# Patient Record
Sex: Male | Born: 1950 | Race: White | Hispanic: No | Marital: Married | Smoking: Former smoker
Health system: Southern US, Community
[De-identification: ages and names within clinical notes are randomized; demographics above are authoritative.]

## PROBLEM LIST (undated history)

## (undated) DIAGNOSIS — F17200 Nicotine dependence, unspecified, uncomplicated: Secondary | ICD-10-CM

## (undated) DIAGNOSIS — R3129 Other microscopic hematuria: Secondary | ICD-10-CM

## (undated) DIAGNOSIS — R7301 Impaired fasting glucose: Secondary | ICD-10-CM

## (undated) DIAGNOSIS — K5790 Diverticulosis of intestine, part unspecified, without perforation or abscess without bleeding: Secondary | ICD-10-CM

## (undated) DIAGNOSIS — E785 Hyperlipidemia, unspecified: Secondary | ICD-10-CM

## (undated) DIAGNOSIS — K648 Other hemorrhoids: Secondary | ICD-10-CM

## (undated) DIAGNOSIS — K635 Polyp of colon: Secondary | ICD-10-CM

## (undated) HISTORY — DX: Other hemorrhoids: K64.8

## (undated) HISTORY — DX: Polyp of colon: K63.5

## (undated) HISTORY — DX: Diverticulosis of intestine, part unspecified, without perforation or abscess without bleeding: K57.90

## (undated) HISTORY — PX: COLONOSCOPY: SHX174

## (undated) HISTORY — DX: Impaired fasting glucose: R73.01

## (undated) HISTORY — DX: Nicotine dependence, unspecified, uncomplicated: F17.200

## (undated) HISTORY — DX: Hyperlipidemia, unspecified: E78.5

## (undated) HISTORY — DX: Other microscopic hematuria: R31.29

---

## 1994-12-09 HISTORY — PX: CHEST TUBE INSERTION: SHX231

## 2006-02-06 DIAGNOSIS — R7301 Impaired fasting glucose: Secondary | ICD-10-CM

## 2006-02-06 HISTORY — DX: Impaired fasting glucose: R73.01

## 2006-10-23 ENCOUNTER — Ambulatory Visit: Payer: Self-pay | Admitting: Family Medicine

## 2006-11-25 ENCOUNTER — Ambulatory Visit: Payer: Self-pay | Admitting: Family Medicine

## 2007-04-23 ENCOUNTER — Ambulatory Visit: Payer: Self-pay | Admitting: Family Medicine

## 2007-08-31 ENCOUNTER — Ambulatory Visit: Payer: Self-pay | Admitting: Internal Medicine

## 2007-09-10 ENCOUNTER — Encounter: Payer: Self-pay | Admitting: Internal Medicine

## 2007-09-10 ENCOUNTER — Ambulatory Visit: Payer: Self-pay | Admitting: Internal Medicine

## 2007-09-21 ENCOUNTER — Ambulatory Visit: Payer: Self-pay | Admitting: Family Medicine

## 2008-04-12 ENCOUNTER — Ambulatory Visit: Payer: Self-pay | Admitting: Family Medicine

## 2008-11-09 ENCOUNTER — Ambulatory Visit: Payer: Self-pay | Admitting: Family Medicine

## 2009-01-10 ENCOUNTER — Ambulatory Visit: Payer: Self-pay | Admitting: Family Medicine

## 2009-02-15 ENCOUNTER — Ambulatory Visit: Payer: Self-pay | Admitting: Family Medicine

## 2010-01-24 ENCOUNTER — Ambulatory Visit: Payer: Self-pay | Admitting: Family Medicine

## 2010-03-12 ENCOUNTER — Ambulatory Visit: Payer: Self-pay | Admitting: Family Medicine

## 2010-03-13 ENCOUNTER — Encounter: Admission: RE | Admit: 2010-03-13 | Discharge: 2010-03-13 | Payer: Self-pay | Admitting: Family Medicine

## 2010-05-08 ENCOUNTER — Ambulatory Visit: Payer: Self-pay | Admitting: Physician Assistant

## 2010-12-20 ENCOUNTER — Ambulatory Visit
Admission: RE | Admit: 2010-12-20 | Discharge: 2010-12-20 | Payer: Self-pay | Source: Home / Self Care | Attending: Family Medicine | Admitting: Family Medicine

## 2011-07-24 ENCOUNTER — Other Ambulatory Visit: Payer: Self-pay | Admitting: *Deleted

## 2011-07-24 DIAGNOSIS — E785 Hyperlipidemia, unspecified: Secondary | ICD-10-CM

## 2011-07-24 MED ORDER — ROSUVASTATIN CALCIUM 20 MG PO TABS: 20.0000 mg | ORAL_TABLET | Freq: Every day | ORAL | Status: DC

## 2011-09-03 ENCOUNTER — Encounter: Payer: Self-pay | Admitting: Family Medicine

## 2011-09-09 ENCOUNTER — Ambulatory Visit (INDEPENDENT_AMBULATORY_CARE_PROVIDER_SITE_OTHER): Payer: 59 | Admitting: Family Medicine

## 2011-09-09 ENCOUNTER — Encounter: Payer: Self-pay | Admitting: Family Medicine

## 2011-09-09 VITALS — BP 130/80 | HR 72 | Ht 70.0 in | Wt 163.0 lb

## 2011-09-09 DIAGNOSIS — Z125 Encounter for screening for malignant neoplasm of prostate: Secondary | ICD-10-CM

## 2011-09-09 DIAGNOSIS — E7800 Pure hypercholesterolemia, unspecified: Secondary | ICD-10-CM

## 2011-09-09 DIAGNOSIS — Z23 Encounter for immunization: Secondary | ICD-10-CM

## 2011-09-09 DIAGNOSIS — F17200 Nicotine dependence, unspecified, uncomplicated: Secondary | ICD-10-CM

## 2011-09-09 DIAGNOSIS — Z0000 Encounter for general adult medical examination without abnormal findings: Secondary | ICD-10-CM

## 2011-09-09 DIAGNOSIS — E785 Hyperlipidemia, unspecified: Secondary | ICD-10-CM

## 2011-09-09 LAB — LIPID PANEL
Cholesterol: 147 mg/dL (ref 0–200)
HDL: 43 mg/dL (ref >39)

## 2011-09-09 LAB — COMPREHENSIVE METABOLIC PANEL
ALT: 28 U/L (ref 0–53)
AST: 23 U/L (ref 0–37)
BUN: 16 mg/dL (ref 6–23)
CO2: 26 mEq/L (ref 19–32)
Chloride: 102 mEq/L (ref 96–112)
Creat: 1.12 mg/dL (ref 0.50–1.35)
Glucose, Bld: 92 mg/dL (ref 70–99)
Total Bilirubin: 0.5 mg/dL (ref 0.3–1.2)

## 2011-09-09 LAB — PSA: PSA: 2.04 ng/mL (ref <=4.00)

## 2011-09-09 LAB — POCT URINALYSIS DIPSTICK
Bilirubin, UA: NEGATIVE
Ketones, UA: NEGATIVE
Leukocytes, UA: NEGATIVE
Protein, UA: NEGATIVE
pH, UA: 5

## 2011-09-09 MED ORDER — ROSUVASTATIN CALCIUM 20 MG PO TABS: 20.0000 mg | ORAL_TABLET | Freq: Every day | ORAL | Status: DC

## 2011-09-09 NOTE — Patient Instructions (Addendum)
HEALTH MAINTENANCE RECOMMENDATIONS:  It is recommended that you get at least 30 minutes of aerobic exercise at least 5 days/week (for weight loss, you may need as much as 60-90 minutes). This can be any activity that gets your heart rate up. This can be divided in 10-15 minute intervals if needed, but try and build up your endurance at least once a week.  Weight bearing exercise is also recommended twice weekly.  Eat a healthy diet with lots of vegetables, fruits and fiber.  "Colorful" foods have a lot of vitamins (ie green vegetables, tomatoes, red peppers, etc).  Limit sweet tea, regular sodas and alcoholic beverages, all of which has a lot of calories and sugar.  Up to 2 alcoholic drinks daily may be beneficial for men (unless trying to lose weight, watch sugars).  Drink a lot of water.  Sunscreen of at least SPF 30 should be used on all sun-exposed parts of the skin when outside between the hours of 10 am and 4 pm (not just when at beach or pool, but even with exercise, golf, tennis, and yard work!)  Use a sunscreen that says "broad spectrum" so it covers both UVA and UVB rays, and make sure to reapply every 1-2 hours.  Remember to change the batteries in your smoke detectors when changing your clock times in the spring and fall.  Use your seat belt every time you are in a car, and please drive safely and not be distracted with cell phones and texting while driving.   PLEASE QUIT SMOKING!!  Please check into Zostavax (shingles vaccine) coverage with your insurance.  If you would like the shot, schedule a nurse visit  F/u with dentist for routine oral screenings

## 2011-09-09 NOTE — Progress Notes (Signed)
Travis Baldwin is a 60 y.o. male who presents for a complete physical.  He has the following concerns: Lump behind L ear, described as a "knot", which has been present for years.  Only slightly larger than when originally noted, no change in size over the last 2 years.  Not painful.  Hyperlipidemia follow-up:  Patient is reportedly following a low-fat, low cholesterol diet.  Compliant with medications and denies medication side effects   Immunization History  Administered Date(s) Administered  . Influenza Whole 09/09/1989, 12/05/1999  . PPD Test 09/10/1995  . Pneumococcal Polysaccharide 09/10/1995  . Td 11/23/1997  . Tdap 01/10/2009   Last colonoscopy: 2008, Dr. Leone Payor (hyperplastic and adenomatous polyp) Last PSA: 05/2010 Exercise: walking on the job, otherwise no exercise Ophtho: yearly Dentist: a couple of years ago (had all his teeth pulled)  Past Medical History  Diagnosis Date  . Hyperlipidemia   . Impaired fasting glucose 02/2006  . Smoker   . Colon polyp     hyperplastic, adenomatous (2008); Dr. Leone Payor  . Diverticulosis   . Microscopic hematuria     followed by [facility]; normal cystoscopy 05/2009    Past Surgical History  Procedure Date  . Chest tube insertion 1996    pockets of infection on outside of lung    History   Social History  . Marital Status: Married    Spouse Name: Aggie Cosier    Number of Children: 0  . Years of Education: N/A   Occupational History  . 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 Smoking status: Current Everyday Smoker -- 1.0 packs/day for 39 years  . Smokeless tobacco: Never Used  . Alcohol Use: Yes     6 drinks per week (one night/week)  . Drug Use: No  . Sexually Active: Yes -- Male partner(s)   Other Topics Concern  . Not on file   Social History Narrative  . No narrative on file    Family History  Problem Relation Age of Onset  . Hypertension Mother   . Hyperlipidemia  Mother   . Cancer Father     bone (jaw)  . Cancer Brother     sarcoma  . Diabetes Maternal Aunt   . Diabetes Maternal Grandmother   . Diabetes Maternal Grandfather     Current outpatient prescriptions:Aspirin-Salicylamide-Caffeine (BC HEADACHE POWDER PO), Take 1 packet by mouth as needed.  , Disp: , Rfl: ;  Multiple Vitamins-Minerals (MULTIVITAMIN WITH MINERALS) tablet, Take 1 tablet by mouth daily.  , Disp: , Rfl: ;  naproxen (NAPROSYN) 500 MG tablet, Take 500 mg by mouth as needed.  , Disp: , Rfl: ;  Pseudoeph-Doxylamine-DM-APAP (NYQUIL PO), Take 1 each by mouth as needed.  , Disp: , Rfl:  rosuvastatin (CRESTOR) 20 MG tablet, Take 1 tablet (20 mg total) by mouth at bedtime., Disp: 30 tablet, Rfl: 11  Allergies  Allergen Reactions  . Codeine Hives  . Vicodin (Hydrocodone-Acetaminophen) Other (See Comments)    Shakiness.   ROS: The patient denies anorexia, fever, weight changes, headaches,  vision loss (wears 2 different types of contacts--1 for reading, other for distance), decreased hearing, ear pain, hoarseness, chest pain, palpitations, dizziness, syncope, dyspnea on exertion, cough, swelling, nausea, vomiting, diarrhea, constipation, abdominal pain, melena, indigestion/heartburn, gross hematuria, incontinence, erectile dysfunction, nocturia, weakened urine stream, dysuria, genital lesions, joint pains, numbness, tingling, weakness, tremor, suspicious skin lesions, depression, anxiety, abnormal bleeding/bruising, or enlarged lymph nodes +occasional BRB on toilet paper (known hemorrhoids); occasional  back strain  PHYSICAL EXAM: BP 130/80  Pulse 72  Ht 5\' 10"  (1.778 m)  Wt 163 lb (73.936 kg)  BMI 23.39 kg/m2  General Appearance:    Alert, cooperative, no distress, appears stated age  Head:    Normocephalic, without obvious abnormality, atraumatic  Eyes:    PERRL, conjunctiva/corneas clear, EOM's intact, fundi    benign  Ears:    Normal TM's and external ear canals. 4-5 mm  subcutaneous mobile cyst behind L ear  Nose:   Nares normal, mucosa normal, no drainage or sinus   tenderness  Throat:   Lips, mucosa, and tongue normal; wearing upper and lower dentures.  Visualized mucosa appears normal  Neck:   Supple, no lymphadenopathy;  thyroid:  no   enlargement/tenderness/nodules; no carotid   bruit or JVD  Back:    Spine nontender, no curvature, ROM normal, no CVA     tenderness  Lungs:     Clear to auscultation bilaterally without wheezes, rales or     ronchi; respirations unlabored  Chest Wall:    No tenderness or deformity   Heart:    Regular rate and rhythm, S1 and S2 normal, no murmur, rub   or gallop  Breast Exam:    No chest wall tenderness, masses or gynecomastia  Abdomen:     Soft, non-tender, nondistended, normoactive bowel sounds,    no masses, no hepatosplenomegaly  Genitalia:    Normal male external genitalia without lesions.  Testicles without masses.  No inguinal hernias.  Rectal:    Normal sphincter tone, no masses or tenderness; guaiac negative stool.  Prostate smooth, no nodules, slightly enlarged.  Extremities:   No clubbing, cyanosis or edema  Pulses:   2+ and symmetric all extremities  Skin:   Skin color, texture, turgor normal, no rashes or lesions. Mole R cheek oddly shaped (like L-quote), unchanged per pt, uniform color  Lymph nodes:   Cervical, supraclavicular, and axillary nodes normal  Neurologic:   CNII-XII intact, normal strength, sensation and gait; reflexes 2+ and symmetric throughout          Psych:   Normal mood, affect, hygiene and grooming.     ASSESSMENT/PLAN:  1. Routine general medical examination at a health care facility  POCT Urinalysis Dipstick, Visual acuity screening  2. Need for pneumococcal vaccination  Pneumococcal polysaccharide vaccine 23-valent greater than or equal to 2yo subcutaneous/IM  3. Need for influenza vaccination  Flu vaccine greater than or equal to 3yo preservative free IM  4. Pure hypercholesterolemia   Comprehensive metabolic panel, Lipid panel  5. Special screening for malignant neoplasm of prostate  PSA  6. Hyperlipemia  rosuvastatin (CRESTOR) 20 MG tablet    PSA screening, recommended at least 30 minutes of aerobic activity at least 5 days/week; proper sunscreen use reviewed; healthy diet and alcohol recommendations (less than or equal to 2 drinks/day) reviewed; regular seatbelt use; changing batteries in smoke detectors. Self-testicular exams. Immunization recommendations discussed--pneumovax and flu shot given.  Zostavax recommended and discussed--to check insurance coverage.  Colonoscopy recommendations reviewed--likely due 2013.  Counseled regarding smoking cessation

## 2011-09-10 ENCOUNTER — Encounter: Payer: Self-pay | Admitting: Family Medicine

## 2011-11-04 ENCOUNTER — Encounter: Payer: Self-pay | Admitting: Family Medicine

## 2011-11-04 ENCOUNTER — Ambulatory Visit (INDEPENDENT_AMBULATORY_CARE_PROVIDER_SITE_OTHER): Payer: 59 | Admitting: Family Medicine

## 2011-11-04 VITALS — BP 138/80 | HR 80 | Ht 70.0 in | Wt 165.0 lb

## 2011-11-04 DIAGNOSIS — G5700 Lesion of sciatic nerve, unspecified lower limb: Secondary | ICD-10-CM

## 2011-11-04 DIAGNOSIS — M5430 Sciatica, unspecified side: Secondary | ICD-10-CM

## 2011-11-04 DIAGNOSIS — M5432 Sciatica, left side: Secondary | ICD-10-CM

## 2011-11-04 MED ORDER — TRAMADOL HCL 50 MG PO TABS: 50.0000 mg | ORAL_TABLET | Freq: Four times a day (QID) | ORAL | Status: DC | PRN

## 2011-11-04 MED ORDER — CYCLOBENZAPRINE HCL 10 MG PO TABS: 10.0000 mg | ORAL_TABLET | Freq: Three times a day (TID) | ORAL | Status: DC | PRN

## 2011-11-04 MED ORDER — METHYLPREDNISOLONE 4 MG PO KIT: PACK | ORAL | Status: AC

## 2011-11-04 NOTE — Progress Notes (Signed)
Chief complaint:  left hip and leg pain x 2 weeks. Pt states that he had a cold and he coughed and pulled a muscle in his back, that pain lasted a week and went away. Now complains about hip and leg pain(L).  HPI:  Symptoms began with URI, described as a "cold in his chest".  He took Mucinex, was coughing a lot, and felt the onset of burning in his lower back (center portion of lower back).  He has been treating with heat (ie soaking in bath) and wearing back brace at work.  Pain in the back has resolved, but moved into the left hip and down the lateral aspect of the left leg.  Also notes some tingling in the outer and anterior calf--sensation feels different than his other leg, described as a "slight numbness".  Has significant pain in left hip and lateral leg, worse at night, but feels better to sleep on that side (the pressure improves it).  Has been taking 800mg  ibuprofen twice daily for 4-5 days, then switched to the Naprosyn 500mg  twice daily for about 3 days, and didn't notice much difference.  Went back to ibuprofen this morning.  Past Medical History  Diagnosis Date  . Hyperlipidemia   . Impaired fasting glucose 02/2006  . Smoker   . Colon polyp     hyperplastic, adenomatous (2008); Dr. Leone Payor  . Diverticulosis   . Microscopic hematuria     followed by [facility]; normal cystoscopy 05/2009    Past Surgical History  Procedure Date  . Chest tube insertion 1996    pockets of infection on outside of lung    History   Social History  . Marital Status: Married    Spouse Name: Aggie Cosier    Number of Children: 0  . Years of Education: N/A   Occupational History  . 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 Smoking status: Current Everyday Smoker -- 1.0 packs/day for 39 years  . Smokeless tobacco: Never Used  . Alcohol Use: Yes     6 drinks per week (one night/week)  . Drug Use: No  . Sexually Active: Yes -- Male partner(s)   Other Topics  Concern  . Not on file   Social History Narrative  . No narrative on file    Family History  Problem Relation Age of Onset  . Hypertension Mother   . Hyperlipidemia Mother   . Cancer Father     bone (jaw)  . Cancer Brother     sarcoma  . Diabetes Maternal Aunt   . Diabetes Maternal Grandmother   . Diabetes Maternal Grandfather    Current Outpatient Prescriptions on File Prior to Visit  Medication Sig Dispense Refill  . Multiple Vitamins-Minerals (MULTIVITAMIN WITH MINERALS) tablet Take 1 tablet by mouth daily.        . naproxen (NAPROSYN) 500 MG tablet Take 500 mg by mouth as needed.        . rosuvastatin (CRESTOR) 20 MG tablet Take 1 tablet (20 mg total) by mouth at bedtime.  30 tablet  11  . Aspirin-Salicylamide-Caffeine (BC HEADACHE POWDER PO) Take 1 packet by mouth as needed.        . Pseudoeph-Doxylamine-DM-APAP (NYQUIL PO) Take 1 each by mouth as needed.          Allergies  Allergen Reactions  . Codeine Hives  . Vicodin (Hydrocodone-Acetaminophen) Other (See Comments)    Shakiness.   ROS:  Denies fevers, URI symptoms,  shortness of breath, chest pain.  Back pain resolved.  Denies urinary symptoms.  Denies weakness in lower extremity, no rashes  PHYSICAL EXAM: BP 138/80  Pulse 80  Ht 5\' 10"  (1.778 m)  Wt 165 lb (74.844 kg)  BMI 23.68 kg/m2 Well developed, pleasant male, in no dsitress Back:  Spine nontender.  Mild tenderness at L SI joint, but more tender at sciatic notch and into buttock.  Decrease ROM of pyriformis (tight pyriformis) Negative straight leg raise DTR's 2+ and symmetric. Normal sensation and strength.  Normal gait Skin: no rashes  ASSESSMENT/PLAN: 1. Sciatica of left side  traMADol (ULTRAM) 50 MG tablet, methylPREDNISolone (MEDROL, PAK,) 4 MG tablet, cyclobenzaprine (FLEXERIL) 10 MG tablet, DISCONTINUED: cyclobenzaprine (FLEXERIL) 10 MG tablet  2. Pyriformis syndrome      Sciatica--I suspect this is due to SI dysfunction and pyriformis  syndrome. Shown pyriformis stretches. Add Flexeril to take at bedtime. Continue heat, stretches, ibuprofen OR naprosyn. If not improving with these measures in the next 2-3 days, then STOP the ibuprofen/naprosyn and change to Medrol dosepak.  Risks and side effects of steroids were reviewed   Quitting smoking was strongly encouraged

## 2011-11-04 NOTE — Patient Instructions (Addendum)
Sciatica--I suspect this is due to SI dysfunction and pyriformis syndrome. Shown pyriformis stretches--do 5-10 repetitions of the stretch 2-3 times daily. Take Flexeril at bedtime (will make you sleepy). Continue heat, stretches, ibuprofen OR naprosyn. If not improving with these measures in the next 2-3 days, then STOP the ibuprofen/naprosyn and change to Medrol dosepak.  Risks and side effects of steroids were reviewed You may use ultram for pain relief   Sciatica Sciatica is a weakness and/or changes in sensation (tingling, jolts, hot and cold, numbness) along the path the sciatic nerve travels. Irritation or damage to lumbar nerve roots is often also referred to as lumbar radiculopathy.  Lumbar radiculopathy (Sciatica) is the most common form of this problem. Radiculopathy can occur in any of the nerves coming out of the spinal cord. The problems caused depend on which nerves are involved. The sciatic nerve is the large nerve supplying the branches of nerves going from the hip to the toes. It often causes a numbness or weakness in the skin and/or muscles that the sciatic nerve serves. It also may cause symptoms (problems) of pain, burning, tingling, or electric shock-like feelings in the path of this nerve. This usually comes from injury to the fibers that make up the sciatic nerve. Some of these symptoms are low back pain and/or unpleasant feelings in the following areas:  From the mid-buttock down the back of the leg to the back of the knee.   And/or the outside of the calf and top of the foot.   And/or behind the inner ankle to the sole of the foot.  CAUSES   Herniated or slipped disc. Discs are the little cushions between the bones in the back.   Pressure by the piriformis muscle in the buttock on the sciatic nerve (Piriformis Syndrome).   Misalignment of the bones in the lower back and buttocks (Sacroiliac Joint Derangement).   Narrowing of the spinal canal that puts pressure on or  pinches the fibers that make up the sciatic nerve.   A slipped vertebra that is out of line with those above or beneath it.   Abnormality of the nervous system itself so that nerve fibers do not transmit signals properly, especially to feet and calves (neuropathy).   Tumor (this is rare).  Your caregiver can usually determine the cause of your sciatica and begin the treatment most likely to help you. TREATMENT  Taking over-the-counter painkillers, physical therapy, rest, exercise, spinal manipulation, and injections of anesthetics and/or steroids may be used. Surgery, acupuncture, and Yoga can also be effective. Mind over matter techniques, mental imagery, and changing factors such as your bed, chair, desk height, posture, and activities are other treatments that may be helpful. You and your caregiver can help determine what is best for you. With proper diagnosis, the cause of most sciatica can be identified and removed. Communication and cooperation between your caregiver and you is essential. If you are not successful immediately, do not be discouraged. With time, a proper treatment can be found that will make you comfortable. HOME CARE INSTRUCTIONS   If the pain is coming from a problem in the back, applying ice to that area for 15 to 20 minutes, 3 to 4 times per day while awake, may be helpful. Put the ice in a plastic bag. Place a towel between the bag of ice and your skin.   You may exercise or perform your usual activities if these do not aggravate your pain, or as suggested by your caregiver.  Only take over-the-counter or prescription medicines for pain, discomfort, or fever as directed by your caregiver.   If your caregiver has given you a follow-up appointment, it is very important to keep that appointment. Not keeping the appointment could result in a chronic or permanent injury, pain, and disability. If there is any problem keeping the appointment, you must call back to this facility  for assistance.  SEEK IMMEDIATE MEDICAL CARE IF:   You experience loss of control of bowel or bladder.   You have increasing weakness in the trunk, buttocks, or legs.   There is numbness in any areas from the hip down to the toes.   You have difficulty walking or keeping your balance.   You have any of the above, with fever or forceful vomiting.  Document Released: 11/19/2001 Document Revised: 08/07/2011 Document Reviewed: 07/08/2008 [facility] Patient Information 2012 [place], [place].

## 2012-06-26 ENCOUNTER — Other Ambulatory Visit: Payer: Self-pay

## 2012-08-25 ENCOUNTER — Encounter: Payer: Self-pay | Admitting: Internal Medicine

## 2012-10-02 ENCOUNTER — Encounter: Payer: Self-pay | Admitting: Internal Medicine

## 2012-10-09 ENCOUNTER — Ambulatory Visit (INDEPENDENT_AMBULATORY_CARE_PROVIDER_SITE_OTHER): Payer: 59 | Admitting: Medical

## 2012-10-09 ENCOUNTER — Encounter: Payer: Self-pay | Admitting: Medical

## 2012-10-09 VITALS — BP 110/60 | HR 76 | Temp 98.0°F | Resp 16 | Ht 71.0 in | Wt 167.0 lb

## 2012-10-09 DIAGNOSIS — F17200 Nicotine dependence, unspecified, uncomplicated: Secondary | ICD-10-CM

## 2012-10-09 DIAGNOSIS — E785 Hyperlipidemia, unspecified: Secondary | ICD-10-CM

## 2012-10-09 DIAGNOSIS — Z23 Encounter for immunization: Secondary | ICD-10-CM

## 2012-10-09 LAB — CBC WITH DIFFERENTIAL/PLATELET
Basophils Absolute: 0 10*3/uL (ref 0.0–0.1)
Basophils Relative: 0 % (ref 0–1)
Eosinophils Absolute: 0.2 10*3/uL (ref 0.0–0.7)
Eosinophils Relative: 3 % (ref 0–5)
HCT: 41.2 % (ref 39.0–52.0)
Lymphs Abs: 1.9 10*3/uL (ref 0.7–4.0)
MCHC: 33.7 g/dL (ref 30.0–36.0)
MCV: 94.5 fL (ref 78.0–100.0)
Neutro Abs: 4.8 10*3/uL (ref 1.7–7.7)
RBC: 4.36 MIL/uL (ref 4.22–5.81)
RDW: 13.4 % (ref 11.5–15.5)
WBC: 7.3 10*3/uL (ref 4.0–10.5)

## 2012-10-09 LAB — COMPREHENSIVE METABOLIC PANEL
ALT: 23 U/L (ref 0–53)
AST: 19 U/L (ref 0–37)
Alkaline Phosphatase: 54 U/L (ref 39–117)
BUN: 17 mg/dL (ref 6–23)
Calcium: 9.5 mg/dL (ref 8.4–10.5)
Creat: 1.1 mg/dL (ref 0.50–1.35)
Glucose, Bld: 92 mg/dL (ref 70–99)
Total Bilirubin: 0.6 mg/dL (ref 0.3–1.2)

## 2012-10-09 LAB — LIPID PANEL
Cholesterol: 138 mg/dL (ref 0–200)
HDL: 48 mg/dL (ref >39)
Triglycerides: 68 mg/dL (ref <150)

## 2012-10-09 NOTE — Progress Notes (Signed)
  Subjective:   HPI  Travis Baldwin is a 61 y.o. male who presents for recheck on hyperlipidemia.  Doing well with out c/o.   compliant with crestor for years, but continues to smoke.  Not interested in stopping tobacco.  Is active on the job all day, doesn't exercise outside of work.  No self or family hx/o heart disease.  Mother has hyperlipidemia.  In general has no other c/o.    The following portions of the patient's history were reviewed and updated as appropriate: allergies, current medications, past family history, past medical history, past social history, past surgical history and problem list.  Past Medical History  Diagnosis Date  . Hyperlipidemia   . Impaired fasting glucose 02/2006  . Smoker   . Colon polyp     hyperplastic, adenomatous (2008); Dr. Leone Payor  . Diverticulosis   . Microscopic hematuria     followed by [facility]; normal cystoscopy 05/2009    Allergies  Allergen Reactions  . Codeine Hives  . Vicodin (Hydrocodone-Acetaminophen) Other (See Comments)    Shakiness.     Review of Systems ROS reviewed and was negative other than noted in HPI or above.    Objective:   Physical Exam  General appearance: alert, no distress, WD/WN erythema, pharynx normal Oral cavity: MMM, no lesions Neck: supple, no lymphadenopathy, no thyromegaly, no masses, no bruits Heart: RRR, normal S1, S2, no murmurs Lungs: CTA bilaterally, no wheezes, rhonchi, or rales Abdomen: +bs, soft, non tender, non distended, no masses, no hepatomegaly, no splenomegaly Pulses: 2+ symmetric, upper and lower extremities, normal cap refill   Assessment and Plan :     Encounter Diagnoses  Name Primary?  . Hyperlipidemia Yes  . Tobacco use disorder   . Need for prophylactic vaccination and inoculation against influenza    Hyperlipidemia - compliant with crestor.   discussed reasoning behind treatment, his prior abnormal labs and risk factors for heart disease.  C/t crestor, labs  today.  Tobacco use - he is not ready to quit.  discussed risks of tobacco use, the risks for heart disease and PVD in conjunction his hyperlipidemia.  Advised he stop tobacco.  Flu vaccine, VIS and counseling given.

## 2012-10-10 ENCOUNTER — Encounter: Payer: Self-pay | Admitting: Medical

## 2012-10-14 ENCOUNTER — Other Ambulatory Visit: Payer: Self-pay | Admitting: Family Medicine

## 2013-01-25 ENCOUNTER — Telehealth: Payer: Self-pay | Admitting: Family Medicine

## 2013-01-25 NOTE — Telephone Encounter (Signed)
Last saw Travis Baldwin--put on his desk

## 2013-01-25 NOTE — Telephone Encounter (Addendum)
DO YOU WANT TO SWITCH MEDS, CHART IN YOUR OFFICE

## 2013-01-27 ENCOUNTER — Other Ambulatory Visit: Payer: Self-pay | Admitting: Medical

## 2013-01-27 MED ORDER — ATORVASTATIN CALCIUM 20 MG PO TABS: 20.0000 mg | ORAL_TABLET | Freq: Every day | ORAL | Status: DC

## 2013-01-27 NOTE — Telephone Encounter (Addendum)
CALLED PT & HE STATES HE'S NEVER BEEN ON ANYTHING ELSE BESIDES THE CRESTOR & HAS BEEN ON IT FOR 6 YEARS.  [facility] IS REQUIRING A TRIAL OF GENERIC STATIN.  DO YOU WANT TO SWITCH?

## 2013-01-27 NOTE — Telephone Encounter (Signed)
This is stupid.   But if needed, lets switch him to lipitor.

## 2013-01-28 ENCOUNTER — Telehealth: Payer: Self-pay | Admitting: Medical

## 2013-01-28 NOTE — Telephone Encounter (Signed)
LM

## 2013-03-22 ENCOUNTER — Telehealth: Payer: Self-pay | Admitting: Internal Medicine

## 2013-03-22 NOTE — Telephone Encounter (Signed)
Need CPX, recheck, fasting

## 2013-03-22 NOTE — Telephone Encounter (Signed)
Refill request for naproxen 500mg  #60 to [facility] [address]

## 2013-03-22 NOTE — Telephone Encounter (Signed)
Hasn't seen me since 2012.  Redirecting to [facility]

## 2013-03-23 NOTE — Telephone Encounter (Signed)
Patient is aware that he is due for a physical but he will need to call us back to schedule the appointment. CLS

## 2013-04-08 ENCOUNTER — Encounter: Payer: Self-pay | Admitting: Internal Medicine

## 2013-05-10 ENCOUNTER — Encounter: Payer: Self-pay | Admitting: Internal Medicine

## 2013-05-11 ENCOUNTER — Other Ambulatory Visit: Payer: Self-pay | Admitting: Medical

## 2013-06-28 ENCOUNTER — Ambulatory Visit (INDEPENDENT_AMBULATORY_CARE_PROVIDER_SITE_OTHER): Payer: BC Managed Care – PPO | Admitting: Family Medicine

## 2013-06-28 VITALS — BP 112/80 | HR 72 | Ht 70.5 in | Wt 165.0 lb

## 2013-06-28 DIAGNOSIS — F17200 Nicotine dependence, unspecified, uncomplicated: Secondary | ICD-10-CM

## 2013-06-28 DIAGNOSIS — E7800 Pure hypercholesterolemia, unspecified: Secondary | ICD-10-CM

## 2013-06-28 DIAGNOSIS — Z79899 Other long term (current) drug therapy: Secondary | ICD-10-CM

## 2013-06-28 LAB — COMPREHENSIVE METABOLIC PANEL
Albumin: 4.3 g/dL (ref 3.5–5.2)
Alkaline Phosphatase: 59 U/L (ref 39–117)
BUN: 17 mg/dL (ref 6–23)
CO2: 25 mEq/L (ref 19–32)
Calcium: 9.8 mg/dL (ref 8.4–10.5)
Potassium: 4.3 mEq/L (ref 3.5–5.3)
Sodium: 135 mEq/L (ref 135–145)

## 2013-06-28 LAB — LIPID PANEL: LDL Cholesterol: 87 mg/dL (ref 0–99)

## 2013-06-29 NOTE — Progress Notes (Addendum)
EPIC WAS DOWN.  SEE PAPER DOCUMENTATION OF OFFICE VISIT--SCANNED SCANNED NOTE IS UNDER MEDIA TAB

## 2013-06-30 ENCOUNTER — Other Ambulatory Visit: Payer: Self-pay | Admitting: *Deleted

## 2013-06-30 MED ORDER — ATORVASTATIN CALCIUM 20 MG PO TABS: 20.0000 mg | ORAL_TABLET | Freq: Every day | ORAL | Status: DC

## 2013-07-09 ENCOUNTER — Ambulatory Visit (AMBULATORY_SURGERY_CENTER): Payer: BC Managed Care – PPO

## 2013-07-09 VITALS — Ht 70.5 in | Wt 167.8 lb

## 2013-07-09 DIAGNOSIS — Z860100 Personal history of colon polyps, unspecified: Secondary | ICD-10-CM

## 2013-07-09 MED ORDER — NA SULFATE-K SULFATE-MG SULF 17.5-3.13-1.6 GM/177ML PO SOLN: 1.0000 | Freq: Once | ORAL | Status: DC

## 2013-07-12 ENCOUNTER — Encounter: Payer: Self-pay | Admitting: Internal Medicine

## 2013-07-23 ENCOUNTER — Ambulatory Visit (AMBULATORY_SURGERY_CENTER): Payer: BC Managed Care – PPO | Admitting: Internal Medicine

## 2013-07-23 ENCOUNTER — Encounter: Payer: Self-pay | Admitting: Internal Medicine

## 2013-07-23 VITALS — BP 118/76 | HR 58 | Temp 97.2°F | Resp 28 | Ht 70.0 in | Wt 167.0 lb

## 2013-07-23 DIAGNOSIS — K648 Other hemorrhoids: Secondary | ICD-10-CM

## 2013-07-23 DIAGNOSIS — Z860100 Personal history of colon polyps, unspecified: Secondary | ICD-10-CM

## 2013-07-23 DIAGNOSIS — K5730 Diverticulosis of large intestine without perforation or abscess without bleeding: Secondary | ICD-10-CM

## 2013-07-23 MED ORDER — SODIUM CHLORIDE 0.9 % IV SOLN: 500.0000 mL | INTRAVENOUS | Status: DC

## 2013-07-23 NOTE — Progress Notes (Signed)
Patient did not have preoperative order for IV antibiotic SSI prophylaxis. (G8918)  Patient did not experience any of the following events: a burn prior to discharge; a fall within the facility; wrong site/side/patient/procedure/implant event; or a hospital transfer or hospital admission upon discharge from the facility. ([id number])  

## 2013-07-23 NOTE — Patient Instructions (Addendum)
No polyps today. You do have diverticulosis and hemorrhoids.  I can help you with the hemorrhoids if you would like - they can be banded. Please schedule an appointment to see me in the office to discuss and start treatment.   Next routine colonoscopy in 7 years - 2021.  I appreciate the opportunity to care for you. Iva Boop, MD, FACG  YOU HAD AN ENDOSCOPIC PROCEDURE TODAY AT THE [facility]: Refer to the procedure report that was given to you for any specific questions about what was found during the examination.  If the procedure report does not answer your questions, please call your gastroenterologist to clarify.  If you requested that your care partner not be given the details of your procedure findings, then the procedure report has been included in a sealed envelope for you to review at your convenience later.  YOU SHOULD EXPECT: Some feelings of bloating in the abdomen. Passage of more gas than usual.  Walking can help get rid of the air that was put into your GI tract during the procedure and reduce the bloating. If you had a lower endoscopy (such as a colonoscopy or flexible sigmoidoscopy) you may notice spotting of blood in your stool or on the toilet paper. If you underwent a bowel prep for your procedure, then you may not have a normal bowel movement for a few days.  DIET: Your first meal following the procedure should be a light meal and then it is ok to progress to your normal diet.  A half-sandwich or bowl of soup is an example of a good first meal.  Heavy or fried foods are harder to digest and may make you feel nauseous or bloated.  Likewise meals heavy in dairy and vegetables can cause extra gas to form and this can also increase the bloating.  Drink plenty of fluids but you should avoid alcoholic beverages for 24 hours.  ACTIVITY: Your care partner should take you home directly after the procedure.  You should plan to take it easy, moving slowly for the rest of  the day.  You can resume normal activity the day after the procedure however you should NOT DRIVE or use heavy machinery for 24 hours (because of the sedation medicines used during the test).    SYMPTOMS TO REPORT IMMEDIATELY: A gastroenterologist can be reached at any hour.  During normal business hours, 8:30 AM to 5:00 PM Monday through Friday, call [phone].  After hours and on weekends, please call the GI answering service at [phone] who will take a message and have the physician on call contact you.   Following lower endoscopy (colonoscopy or flexible sigmoidoscopy):  Excessive amounts of blood in the stool  Significant tenderness or worsening of abdominal pains  Swelling of the abdomen that is new, acute  Fever of 100F or higher  FOLLOW UP: If any biopsies were taken you will be contacted by phone or by letter within the next 1-3 weeks.  Call your gastroenterologist if you have not heard about the biopsies in 3 weeks.  Our staff will call the home number listed on your records the next business day following your procedure to check on you and address any questions or concerns that you may have at that time regarding the information given to you following your procedure. This is a courtesy call and so if there is no answer at the home number and we have not heard from you through the emergency physician  on call, we will assume that you have returned to your regular daily activities without incident.  SIGNATURES/CONFIDENTIALITY: You and/or your care partner have signed paperwork which will be entered into your electronic medical record.  These signatures attest to the fact that that the information above on your After Visit Summary has been reviewed and is understood.  Full responsibility of the confidentiality of this discharge information lies with you and/or your care-partner.

## 2013-07-23 NOTE — Progress Notes (Signed)
A/ox3 pleased with MAC, report toA/ox3 pleased with MAC, report toWendy RN 

## 2013-07-23 NOTE — Op Note (Signed)
[facility] [address] [place], [address]   COLONOSCOPY PROCEDURE REPORT  PATIENT: Baldwin, Travis  MR#: [id number] BIRTHDATE: 09-10-1951 , 61  yrs. old GENDER: Male ENDOSCOPIST: Iva Boop, MD, [facility] PROCEDURE DATE:  07/23/2013 PROCEDURE:   Colonoscopy, screening First Screening Colonoscopy - Avg.  risk and is 50 yrs.  old or older - No.  Prior Negative Screening - Now for repeat screening. N/A  History of Adenoma - Now for follow-up colonoscopy & has been > or = to 3 yrs.  Yes hx of adenoma.  Has been 3 or more years since last colonoscopy.  Polyps Removed Today? No.  Recommend repeat exam, <10 yrs? Yes.  High risk (family or personal hx). ASA CLASS:   Class II INDICATIONS:Patient's personal history of adenomatous colon polyps.  MEDICATIONS: propofol (Diprivan) 200mg  IV, MAC sedation, administered by CRNA, and These medications were titrated to patient response per physician's verbal order  DESCRIPTION OF PROCEDURE:   After the risks benefits and alternatives of the procedure were thoroughly explained, informed consent was obtained.  A digital rectal exam revealed no prostatic nodules, A digital rectal exam revealed the prostate was not enlarged, and A digital rectal exam revealed no rectal mass.   The LB [id number] and LB [id number]  endoscope was introduced through the anus and advanced to the cecum, which was identified by both the appendix and ileocecal valve. No adverse events experienced.   The quality of the prep was adequate using Suprep  The instrument was then slowly withdrawn as the colon was fully examined.    COLON FINDINGS: Moderate diverticulosis was noted in the sigmoid colon.   The colon mucosa was otherwise normal.   A right colon retroflexion was performed.  Retroflexed views revealed internal hemorrhoids. The time to cecum=1 minutes 17 seconds.  Withdrawal time=13 minutes 28 seconds.  The scope was withdrawn and  the procedure completed. COMPLICATIONS: There were no complications.  ENDOSCOPIC IMPRESSION: 1.   Moderate diverticulosis was noted in the sigmoid colon 2.   The colon mucosa was otherwise normal - adequate prep 3.   Internal hemorrhoids  RECOMMENDATIONS: 1.  Repeat Colonscopy in 7 years 2021 - prior adenoma 2008 and large left hyperplastics around 2000 2.   Schedule office visit to evaluate and treat hemorrhoids.   eSigned:  Iva Boop, MD, [facility] 07/23/2013 10:46 AM  cc: The Patient

## 2013-07-26 ENCOUNTER — Telehealth: Payer: Self-pay | Admitting: *Deleted

## 2013-07-26 NOTE — Telephone Encounter (Signed)
  Follow up Call-  Call back number 07/23/2013  Post procedure Call Back phone  # [phone]  Permission to leave phone message Yes     Patient questions:  Do you have a fever, pain , or abdominal swelling? no Pain Score  0 *  Have you tolerated food without any problems? yes  Have you been able to return to your normal activities? yes  Do you have any questions about your discharge instructions: Diet   no Medications  no Follow up visit  no  Do you have questions or concerns about your Care? no  Actions: * If pain score is 4 or above: No action needed, pain <4.

## 2013-08-23 ENCOUNTER — Ambulatory Visit: Payer: BC Managed Care – PPO | Admitting: Internal Medicine

## 2013-09-03 ENCOUNTER — Encounter: Payer: Self-pay | Admitting: Internal Medicine

## 2013-09-03 ENCOUNTER — Ambulatory Visit (INDEPENDENT_AMBULATORY_CARE_PROVIDER_SITE_OTHER): Payer: BC Managed Care – PPO | Admitting: Internal Medicine

## 2013-09-03 VITALS — BP 100/70 | HR 60 | Ht 70.0 in | Wt 165.1 lb

## 2013-09-03 DIAGNOSIS — K648 Other hemorrhoids: Secondary | ICD-10-CM

## 2013-09-03 HISTORY — DX: Other hemorrhoids: K64.8

## 2013-09-03 NOTE — Progress Notes (Signed)
Patient ID: Travis Baldwin, male   DOB: 1951-11-04, 63 y.o.   MRN: [id number]  Sxs are bleeding, Gr 2 prolapse. Regular daily defecation w/o straining. He does spend > 2 minutes on the commode. Reads.  PROCEDURE NOTE: The patient presents with symptomatic grade 2 hemorrhoids (bleeding, prolapse with spontaneous reduction), requesting rubber band ligation of their hemorrhoidal disease.  All risks, benefits and alternative forms of therapy were described and informed consent was obtained.  The decision was made to band the RP and LL internal hemorrhoids, and the [facility] O'Regan System was used to perform band ligation without complication after premedication with 0.125% NTG and 5% lidocaine topically to anal canal.  Digital anorectal examination was then performed to assure proper positioning of the band, and to adjust the banded tissue as required.  The patient was discharged home without pain or other issues.  Dietary and behavioral recommendations were given and (if necessary - prescriptions were given), along with follow-up instructions.  The patient will return in 2-3 weeks for follow-up and possible additional banding as required. No complications were encountered and the patient tolerated the procedure well.

## 2013-09-03 NOTE — Assessment & Plan Note (Signed)
LL and RP columns banded To reduce time on commode Benefiber prn - does not seem to be constipated RTC 2-3 weeks reasses/band RA

## 2013-09-03 NOTE — Patient Instructions (Addendum)
HEMORRHOID BANDING PROCEDURE    FOLLOW-UP CARE   1. The procedure you have had should have been relatively painless since the banding of the area involved does not have nerve endings and there is no pain sensation.  The rubber band cuts off the blood supply to the hemorrhoid and the band may fall off as soon as 48 hours after the banding (the band may occasionally be seen in the toilet bowl following a bowel movement). You may notice a temporary feeling of fullness in the rectum which should respond adequately to plain Tylenol or Motrin.  2. Following the banding, avoid strenuous exercise that evening and resume full activity the next day.  A sitz bath (soaking in a warm tub) or bidet is soothing, and can be useful for cleansing the area after bowel movements.     3. To avoid constipation, take two tablespoons of natural wheat bran, natural oat bran, flax, Benefiber or any over the counter fiber supplement and increase your water intake to 7-8 glasses daily.    4. Unless you have been prescribed anorectal medication, do not put anything inside your rectum for two weeks: No suppositories, enemas, fingers, etc.  5. Occasionally, you may have more bleeding than usual after the banding procedure.  This is often from the untreated hemorrhoids rather than the treated one.  Don't be concerned if there is a tablespoon or so of blood.  If there is more blood than this, lie flat with your bottom higher than your head and apply an ice pack to the area. If the bleeding does not stop within a half an hour or if you feel faint, call our office at [phone] or go to the emergency room.  6. Problems are not common; however, if there is a substantial amount of bleeding, severe pain, chills, fever or difficulty passing urine (very rare) or other problems, you should call us at [phone] or report to the nearest emergency room.  7. Do not stay seated continuously for more than 2-3 hours for a day or two  after the procedure.  Tighten your buttock muscles 10-15 times every two hours and take 10-15 deep breaths every 1-2 hours.  Do not spend more than a few minutes on the toilet if you cannot empty your bowel; instead re-visit the toilet at a later time.    Do not spend too much time on the toilet , don't read on the toilet.   Your next appointment is Oct. 24th 2014 at 11:15am.   I appreciate the opportunity to care for you.

## 2013-09-08 HISTORY — PX: HEMORRHOID BANDING: SHX5850

## 2013-09-29 ENCOUNTER — Encounter: Payer: Self-pay | Admitting: Family Medicine

## 2013-09-29 ENCOUNTER — Ambulatory Visit (INDEPENDENT_AMBULATORY_CARE_PROVIDER_SITE_OTHER): Payer: BC Managed Care – PPO | Admitting: Family Medicine

## 2013-09-29 VITALS — BP 112/74 | HR 68 | Ht 70.0 in | Wt 164.0 lb

## 2013-09-29 DIAGNOSIS — Z125 Encounter for screening for malignant neoplasm of prostate: Secondary | ICD-10-CM

## 2013-09-29 DIAGNOSIS — F17200 Nicotine dependence, unspecified, uncomplicated: Secondary | ICD-10-CM

## 2013-09-29 DIAGNOSIS — Z23 Encounter for immunization: Secondary | ICD-10-CM

## 2013-09-29 DIAGNOSIS — Z0000 Encounter for general adult medical examination without abnormal findings: Secondary | ICD-10-CM

## 2013-09-29 LAB — POCT URINALYSIS DIPSTICK
Ketones, UA: NEGATIVE
Protein, UA: NEGATIVE
Spec Grav, UA: 1.01
Urobilinogen, UA: NEGATIVE

## 2013-09-29 NOTE — Progress Notes (Signed)
Chief Complaint  Patient presents with  . Annual Exam    fasting annual exam. No complaints. Did not do eye exam as he goes the first of the year. UA showed trace leuks and blood, patient is asymptomatic.    Travis Travis is a 62 y.o. male who presents for a complete physical.  He has no specific concerns.  Immunization History  Administered Date(s) Administered  . Influenza Split 09/09/2011, 10/09/2012  . Influenza Whole 09/09/1989, 12/05/1999  . Influenza,inj,Quad PF,36+ Mos 09/29/2013  . PPD Test 09/10/1995  . Pneumococcal Polysaccharide 09/10/1995, 09/09/2011  . Td 11/23/1997  . Tdap 01/10/2009   Last colonoscopy: 07/2013 Last PSA:  09/2011 Exercise: walking on the job, walks the dog daily Ophtho: yearly.  Wears one contact for reading and one for distance.  Declines eye exam today Dentist: wears dentures, last seen 4-5 years  Past Medical History  Diagnosis Date  . Hyperlipidemia   . Impaired fasting glucose 02/2006  . Smoker   . Colon polyp     hyperplastic, adenomatous (2008); Dr. Leone Payor  . Diverticulosis   . Microscopic hematuria     followed by [facility]; normal cystoscopy 05/2009  . Diverticulosis   . Internal hemorrhoids Grade 2 prolapsing with bleeding 09/03/2013    Past Surgical History  Procedure Laterality Date  . Chest tube insertion  1996    pockets of infection on outside of lung  . Colonoscopy  multiple; 07/2013    Dr. Leone Payor    History   Social History  . Marital Status: Married    Spouse Name: Travis Travis    Number of Children: 0  . Years of Education: N/A   Occupational History  . 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 Smoking status: Current Travis Travis Smoker -- 0.50 packs/Baldwin for 39 years    Types: Cigarettes  . Smokeless tobacco: Never Used  . Alcohol Use: 6.0 oz/week    10 Shots of liquor per week     Comment: 1 drink each night (rum and coke)  . Drug Use: No  . Sexual Activity: Yes   Partners: Female   Other Topics Concern  . Not on file   Social History Narrative   Married, lives with wife, 1 dog    Family History  Problem Relation Age of Onset  . Hypertension Mother   . Hyperlipidemia Mother   . Cancer Father     bone (jaw)  . Cancer Brother     sarcoma  . Colon cancer Brother     sarcoma around colon  . Diabetes Maternal Aunt   . Diabetes Maternal Grandmother   . Diabetes Maternal Grandfather     Current outpatient prescriptions:atorvastatin (LIPITOR) 20 MG tablet, Take 1 tablet (20 mg total) by mouth daily., Disp: 30 tablet, Rfl: 5;  co-enzyme Q-10 30 MG capsule, Take 30 mg by mouth daily. , Disp: , Rfl: ;  Multiple Vitamins-Minerals (MULTIVITAMIN WITH MINERALS) tablet, Take 1 tablet by mouth daily., Disp: , Rfl: ;  Aspirin-Salicylamide-Caffeine (BC HEADACHE POWDER PO), Take 1 packet by mouth as needed.  , Disp: , Rfl:  ibuprofen (ADVIL,MOTRIN) 800 MG tablet, Take 800 mg by mouth Travis 8 (eight) hours as needed.  , Disp: , Rfl:   Allergies  Allergen Reactions  . Codeine Hives  . Vicodin [Hydrocodone-Acetaminophen] Other (See Comments)    Shakiness.   ROS: The patient denies anorexia, fever, weight changes, headaches, vision loss (wears 2 different types of contacts--1  for reading, other for distance), decreased hearing, ear pain, hoarseness, chest pain, palpitations, dizziness, syncope, dyspnea on exertion, cough, swelling, nausea, vomiting, diarrhea, constipation, abdominal pain, melena, indigestion/heartburn, gross hematuria, incontinence, erectile dysfunction, nocturia, genital lesions, numbness, tingling, weakness, tremor, suspicious skin lesions, depression, anxiety, abnormal bleeding/bruising, or enlarged lymph nodes.  No recent BRB on toilet paper after BM's--getting treatments for hemorrhoids. Some Left shoulder pain for a couple of ears (thinks related to his rotator cuff).  Some pain if reaching high overhead, but not causing significant daily  symptoms. Some mild hesitancy in starting urine stream, and slightly weakened stream.  Feels like bladder empties completely.  Up just once per night to void.  PHYSICAL EXAM: BP 112/74  Pulse 68  Ht 5\' 10"  (1.778 m)  Wt 164 lb (74.39 kg)  BMI 23.53 kg/m2  General Appearance:  Alert, cooperative, no distress, appears stated age   Head:  Normocephalic, without obvious abnormality, atraumatic   Eyes:  PERRL, conjunctiva/corneas clear, EOM's intact, fundi  benign   Ears:  Normal TM's and external ear canals. 4-5 mm subcutaneous mobile cyst behind L ear, white and superficial in appearance   Nose:  Nares normal, mucosa normal, no drainage or sinus tenderness   Throat:  Lips, mucosa, and tongue normal; wearing upper and lower dentures. White/hypopigmented area at border of upper denture on right side.  Not raised at all  Neck:  Supple, no lymphadenopathy; thyroid: no enlargement/tenderness/nodules; no carotid  bruit or JVD   Back:  Spine nontender, no curvature, ROM normal, no CVA tenderness   Lungs:  Clear to auscultation bilaterally without wheezes, rales or ronchi; respirations unlabored   Chest Wall:  No tenderness or deformity   Heart:  Regular rate and rhythm, S1 and S2 normal, no murmur, rub  or gallop   Breast Exam:  No chest wall tenderness, masses or gynecomastia   Abdomen:  Soft, non-tender, nondistended, normoactive bowel sounds,  no masses, no hepatosplenomegaly   Genitalia:  Normal male external genitalia without lesions. Testicles without masses. No inguinal hernias.   Rectal:  Normal sphincter tone, no masses or tenderness; guaiac negative stool. Prostate smooth, no nodules, slightly enlarged.   Extremities:  No clubbing, cyanosis or edema   Pulses:  2+ and symmetric all extremities   Skin:  Skin color, texture, turgor normal, no rashes or lesions. Mole R cheek oddly shaped (like L-quote), unchanged per pt, uniform color   Lymph nodes:  Cervical, supraclavicular, and  axillary nodes normal   Neurologic:  CNII-XII intact, normal strength, sensation and gait; reflexes 2+ and symmetric throughout          Psych: Normal mood, affect, hygiene and grooming.    ASSESSMENT/PLAN:  Routine general medical examination at a health care facility - Plan: POCT Urinalysis Dipstick  Need for prophylactic vaccination and inoculation against influenza - Plan: Flu Vaccine QUAD 36+ mos IM  Tobacco use disorder  Special screening for malignant neoplasm of prostate - Plan: PSA  Follow up with dentist for full oral exam (when not wearing dentures)  PSA screening, risks/benefits reviewed, recommended at least 30 minutes of aerobic activity at least 5 days/week; proper sunscreen use reviewed; healthy diet and alcohol recommendations (less than or equal to 2 drinks/Baldwin) reviewed; regular seatbelt use; changing batteries in smoke detectors. Self-testicular exams. Immunization recommendations discussed--flu shot given today. Zostavax recommended and discussed--to check insurance coverage. Needs to wait 30 days from flu shot to get.  Colonoscopy recommendations reviewed--UTD.  Counseled regarding smoking cessation

## 2013-09-29 NOTE — Patient Instructions (Addendum)
HEALTH MAINTENANCE RECOMMENDATIONS:  It is recommended that you get at least 30 minutes of aerobic exercise at least 5 days/week (for weight loss, you may need as much as 60-90 minutes). This can be any activity that gets your heart rate up. This can be divided in 10-15 minute intervals if needed, but try and build up your endurance at least once a week.  Weight bearing exercise is also recommended twice weekly.  Eat a healthy diet with lots of vegetables, fruits and fiber.  "Colorful" foods have a lot of vitamins (ie green vegetables, tomatoes, red peppers, etc).  Limit sweet tea, regular sodas and alcoholic beverages, all of which has a lot of calories and sugar.  Up to 2 alcoholic drinks daily may be beneficial for men (unless trying to lose weight, watch sugars).  Drink a lot of water.  Sunscreen of at least SPF 30 should be used on all sun-exposed parts of the skin when outside between the hours of 10 am and 4 pm (not just when at beach or pool, but even with exercise, golf, tennis, and yard work!)  Use a sunscreen that says "broad spectrum" so it covers both UVA and UVB rays, and make sure to reapply every 1-2 hours.  Remember to change the batteries in your smoke detectors when changing your clock times in the spring and fall.  Use your seat belt every time you are in a car, and please drive safely and not be distracted with cell phones and texting while driving.   Check with your insurance for coverage of shingles vaccine (zostavax).  If covered and desired, schedule nurse visit--must be at least 1 month from any other vaccine (ie from today's flu shot).  Please try and quit smoking--start thinking about why/when you smoke (habit, boredom, stress) in order to come up with effective strategies to cut back or quit. Available resources to help you quit include free counseling through [facility] ([facility]Quitline.com or 1-800-QUITNOW), smoking cessation classes through [facility] (call to find out schedule), over-the-counter nicotine replacements, and e-cigarettes (although this may not help break the hand-mouth habit).  Many insurance companies also have smoking cessation programs (which may decrease the cost of patches, meds if enrolled).  If these methods are not effective for you, and you are motivated to quit, return to discuss the possibility of prescription medications.  Follow up with dentist for full oral exam (when not wearing dentures)--there is a slightly discolored area (white) on your upper palate that is partly covered by the dentures.  This needs to be evaluated by a dentist.

## 2013-09-30 ENCOUNTER — Encounter: Payer: Self-pay | Admitting: Family Medicine

## 2013-10-01 ENCOUNTER — Ambulatory Visit (INDEPENDENT_AMBULATORY_CARE_PROVIDER_SITE_OTHER): Payer: BC Managed Care – PPO | Admitting: Internal Medicine

## 2013-10-01 ENCOUNTER — Encounter: Payer: Self-pay | Admitting: Internal Medicine

## 2013-10-01 VITALS — BP 140/70 | HR 76 | Ht 68.5 in | Wt 164.4 lb

## 2013-10-01 DIAGNOSIS — K648 Other hemorrhoids: Secondary | ICD-10-CM

## 2013-10-01 NOTE — Progress Notes (Signed)
Patient ID: Travis Baldwin, male   DOB: [birth year], 62 y.o.   MRN: [id number]   PROCEDURE NOTE: The patient presents with symptomatic grade 2  Hemorrhoids (bleding, prolapse with spontaneous reduction, s/p banding of the LL and RP piles 1 month ago), requesting rubber band ligation of his/her hemorrhoidal disease.  All risks, benefits and alternative forms of therapy were described and informed consent was obtained.  He reports reduced bleeding (none since last banding) and "size" of hemorrhoids. Is using Benefiber.  The decision was made to band the RA internal hemorrhoid, and the [facility] O'Regan System was used to perform band ligation without complication.  Digital anorectal examination was then performed to assure proper positioning of the band, and to adjust the banded tissue as required.  The patient was discharged home without pain or other issues.  Dietary and behavioral recommendations were given and along with follow-up instructions.     The following adjunctive treatments were recommended:  Continue Benefiber  The patient will return in as needed fo  follow-up and possible additional banding as required. Plan to call him in 6-8 weeks to reassess. No complications were encountered and the patient tolerated the procedure well.

## 2013-10-01 NOTE — Patient Instructions (Signed)
HEMORRHOID BANDING PROCEDURE    FOLLOW-UP CARE   1. The procedure you have had should have been relatively painless since the banding of the area involved does not have nerve endings and there is no pain sensation.  The rubber band cuts off the blood supply to the hemorrhoid and the band may fall off as soon as 48 hours after the banding (the band may occasionally be seen in the toilet bowl following a bowel movement). You may notice a temporary feeling of fullness in the rectum which should respond adequately to plain Tylenol or Motrin.  2. Following the banding, avoid strenuous exercise that evening and resume full activity the next day.  A sitz bath (soaking in a warm tub) or bidet is soothing, and can be useful for cleansing the area after bowel movements.     3. To avoid constipation, take two tablespoons of natural wheat bran, natural oat bran, flax, Benefiber or any over the counter fiber supplement and increase your water intake to 7-8 glasses daily.    4. Unless you have been prescribed anorectal medication, do not put anything inside your rectum for two weeks: No suppositories, enemas, fingers, etc.  5. Occasionally, you may have more bleeding than usual after the banding procedure.  This is often from the untreated hemorrhoids rather than the treated one.  Don't be concerned if there is a tablespoon or so of blood.  If there is more blood than this, lie flat with your bottom higher than your head and apply an ice pack to the area. If the bleeding does not stop within a half an hour or if you feel faint, call our office at [phone] or go to the emergency room.  6. Problems are not common; however, if there is a substantial amount of bleeding, severe pain, chills, fever or difficulty passing urine (very rare) or other problems, you should call us at [phone] or report to the nearest emergency room.  7. Do not stay seated continuously for more than 2-3 hours for a day or two  after the procedure.  Tighten your buttock muscles 10-15 times every two hours and take 10-15 deep breaths every 1-2 hours.  Do not spend more than a few minutes on the toilet if you cannot empty your bowel; instead re-visit the toilet at a later time.   Continue your Benefiber.  We will call you in 6-8 weeks to check on you.  I appreciate the opportunity to care for you.

## 2013-10-01 NOTE — Assessment & Plan Note (Signed)
RA column banded Will call for f/u evaluation in 6-8 weeks

## 2014-01-09 ENCOUNTER — Other Ambulatory Visit: Payer: Self-pay | Admitting: Family Medicine

## 2014-03-14 ENCOUNTER — Other Ambulatory Visit: Payer: Self-pay | Admitting: Family Medicine

## 2014-05-08 ENCOUNTER — Other Ambulatory Visit: Payer: Self-pay | Admitting: Family Medicine

## 2014-06-01 ENCOUNTER — Ambulatory Visit (INDEPENDENT_AMBULATORY_CARE_PROVIDER_SITE_OTHER): Payer: BC Managed Care – PPO | Admitting: Family Medicine

## 2014-06-01 ENCOUNTER — Encounter: Payer: Self-pay | Admitting: Family Medicine

## 2014-06-01 VITALS — BP 108/70 | HR 72 | Ht 70.0 in | Wt 162.0 lb

## 2014-06-01 DIAGNOSIS — F17200 Nicotine dependence, unspecified, uncomplicated: Secondary | ICD-10-CM

## 2014-06-01 DIAGNOSIS — E7800 Pure hypercholesterolemia, unspecified: Secondary | ICD-10-CM

## 2014-06-01 LAB — HEPATIC FUNCTION PANEL
ALT: 26 U/L (ref 0–53)
AST: 23 U/L (ref 0–37)
Albumin: 4.5 g/dL (ref 3.5–5.2)
Alkaline Phosphatase: 66 U/L (ref 39–117)
BILIRUBIN INDIRECT: 0.6 mg/dL (ref 0.2–1.2)
Bilirubin, Direct: 0.1 mg/dL (ref 0.0–0.3)
TOTAL PROTEIN: 7 g/dL (ref 6.0–8.3)
Total Bilirubin: 0.7 mg/dL (ref 0.2–1.2)

## 2014-06-01 LAB — LIPID PANEL
Cholesterol: 150 mg/dL (ref 0–200)
HDL: 47 mg/dL (ref >39)
LDL CALC: 88 mg/dL (ref 0–99)
Total CHOL/HDL Ratio: 3.2 Ratio
Triglycerides: 77 mg/dL (ref <150)
VLDL: 15 mg/dL (ref 0–40)

## 2014-06-01 MED ORDER — ATORVASTATIN CALCIUM 20 MG PO TABS: ORAL_TABLET | ORAL | Status: DC

## 2014-06-01 NOTE — Progress Notes (Signed)
Chief Complaint  Patient presents with  . Med check    fasting med check for lipitor.    Hyperlipidemia follow-up:  Patient is reportedly following a low-fat, low cholesterol diet.  Compliant with taking lipitor and denies medication side effects (doing well taking it in the afternoon, along with coenzyme Q10).  He had a recent back strain from heavy lifting at work, took ibuprofen and is much improved. Hasn't needed ibuprofen in the last 2 days.  He started taking Saw Palmetto for weakened urine stream, and that has resolved. Had hemorrhoidal banding 09/2013, no further problems with bleeding  Smoking--he continues to smoke, as does his wife.  He reports that he has quit x 1-3 weeks in the past, the last time was 6 months ago, even despite his wife still smoking.  Past Medical History  Diagnosis Date  . Hyperlipidemia   . Impaired fasting glucose 02/2006  . Smoker   . Colon polyp     hyperplastic, adenomatous (2008); Dr. Carlean Purl  . Diverticulosis   . Microscopic hematuria     followed by [facility]; normal cystoscopy 05/2009  . Internal hemorrhoids Grade 2 prolapsing with bleeding 09/03/2013   Past Surgical History  Procedure Laterality Date  . Chest tube insertion  1996    pockets of infection on outside of lung  . Colonoscopy  multiple; 07/2013    Dr. Carlean Purl  . Hemorrhoid banding  09/2013    Dr. Carlean Purl   History   Social History  . Marital Status: Married    Spouse Name: Clarene Critchley    Number of Children: 0  . Years of Education: N/A   Occupational History  . 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 Smoking status: Current Every Day Smoker -- 0.50 packs/day for 39 years    Types: Cigarettes  . Smokeless tobacco: Never Used  . Alcohol Use: 6.0 oz/week    10 Shots of liquor per week     Comment: 1 drink each night (rum and coke)  . Drug Use: No  . Sexual Activity: Yes    Partners: Female   Other Topics Concern  . Not on file    Social History Narrative   Married, lives with wife, 1 dog    Outpatient Encounter Prescriptions as of 06/01/2014  Medication Sig Note  . atorvastatin (LIPITOR) 20 MG tablet TAKE 1 TABLET (20 MG TOTAL) BY MOUTH DAILY.   Marland Kitchen co-enzyme Q-10 30 MG capsule Take 30 mg by mouth daily.    . Multiple Vitamins-Minerals (MULTIVITAMIN WITH MINERALS) tablet Take 1 tablet by mouth daily.   . Saw Palmetto, Serenoa repens, (SAW PALMETTO PO) Take 2 tablets by mouth daily.   . Aspirin-Salicylamide-Caffeine (BC HEADACHE POWDER PO) Take 1 packet by mouth as needed.   06/01/2014: Not taking; uses occasionally prn headache  . ibuprofen (ADVIL,MOTRIN) 800 MG tablet Take 800 mg by mouth every 8 (eight) hours as needed.   06/01/2014: Took recently due to back strain; none in the last 2 days   Allergies  Allergen Reactions  . Codeine Hives  . Vicodin [Hydrocodone-Acetaminophen] Other (See Comments)    Shakiness.   ROS:  Denies fevers, chills, cough, shortness of breath, chest pain, dizziness, headaches, fatigue, GI complaints or any other concerns.  Moods are good  PHYSICAL EXAM: BP 108/70  Pulse 72  Ht 5\' 10"  (1.778 m)  Wt 162 lb (73.483 kg)  BMI 23.24 kg/m2 Well developed, pleasant male in no distress Neck: no  lymphadenopathy or mass Heart: regular rate and rhythm without murmur Lungs: clear bilaterally Back: no CVA tenderness.   No spinal tenderness.  Muscles are nontender, no spasm Abdomen: soft, nontender, no organomegaly or mass Extremities: no edema Psych: normal mood, affect, hygiene and grooming, in good spirits.   ASSESSMENT/PLAN:  Pure hypercholesterolemia - Plan: Lipid panel, Hepatic function panel, atorvastatin (LIPITOR) 20 MG tablet  Tobacco use disorder  Counseled re: smoking--since he has quit in the past for short-term, even when wife still smoking, he likely will have success.  Just needs the motivation--encouraged him to set a quit date, and to look into 1-800-QUITNOW for the  accountability, support.  Pt to check with his insurance re: zostavax coverage prior to his CPE.  Advised to get flu shot in the fall (here or pharmacy).    F/u 6 mos CPE

## 2014-06-01 NOTE — Patient Instructions (Signed)
Please try and quit smoking--start thinking about why/when you smoke (habit, boredom, stress) in order to come up with effective strategies to cut back or quit. Available resources to help you quit include free counseling through [facility] Quitline ([place]uitline[facility] or 1-800-QUITNOW), smoking cessation classes through [facility] (call to find out schedule), over-the-counter nicotine replacements, and e-cigarettes (although this may not help break the hand-mouth habit).  Many insurance companies also have smoking cessation programs (which may decrease the cost of patches, meds if enrolled).  If these methods are not effective for you, and you are motivated to quit, return to discuss the possibility of prescription medications.  Please check with your insurance re: coverage of zostavax (shingles vaccine). If covered, we will plan to give it at your physical.  Continue your current medications, and lowfat diet.

## 2014-06-02 ENCOUNTER — Encounter: Payer: Self-pay | Admitting: Family Medicine

## 2014-12-07 ENCOUNTER — Encounter: Payer: BC Managed Care – PPO | Admitting: Family Medicine

## 2014-12-10 ENCOUNTER — Other Ambulatory Visit: Payer: Self-pay | Admitting: Family Medicine

## 2014-12-12 ENCOUNTER — Other Ambulatory Visit: Payer: Self-pay | Admitting: Family Medicine

## 2014-12-21 ENCOUNTER — Encounter: Payer: Self-pay | Admitting: Family Medicine

## 2014-12-21 ENCOUNTER — Ambulatory Visit (INDEPENDENT_AMBULATORY_CARE_PROVIDER_SITE_OTHER): Payer: BLUE CROSS/BLUE SHIELD | Admitting: Family Medicine

## 2014-12-21 VITALS — BP 120/74 | HR 60 | Ht 69.0 in | Wt 159.0 lb

## 2014-12-21 DIAGNOSIS — E7800 Pure hypercholesterolemia, unspecified: Secondary | ICD-10-CM

## 2014-12-21 DIAGNOSIS — Z0000 Encounter for general adult medical examination without abnormal findings: Secondary | ICD-10-CM

## 2014-12-21 DIAGNOSIS — F17200 Nicotine dependence, unspecified, uncomplicated: Secondary | ICD-10-CM

## 2014-12-21 DIAGNOSIS — Z23 Encounter for immunization: Secondary | ICD-10-CM

## 2014-12-21 DIAGNOSIS — Z125 Encounter for screening for malignant neoplasm of prostate: Secondary | ICD-10-CM

## 2014-12-21 DIAGNOSIS — Z720 Tobacco use: Secondary | ICD-10-CM

## 2014-12-21 DIAGNOSIS — R5383 Other fatigue: Secondary | ICD-10-CM

## 2014-12-21 LAB — POCT URINALYSIS DIPSTICK
BILIRUBIN UA: NEGATIVE
GLUCOSE UA: NEGATIVE
Ketones, UA: NEGATIVE
LEUKOCYTES UA: NEGATIVE
NITRITE UA: NEGATIVE
Protein, UA: NEGATIVE
Spec Grav, UA: >=1.03
UROBILINOGEN UA: NEGATIVE
pH, UA: 6

## 2014-12-21 LAB — LIPID PANEL
CHOLESTEROL: 164 mg/dL (ref 0–200)
HDL: 48 mg/dL (ref >39)
LDL CALC: 98 mg/dL (ref 0–99)
TRIGLYCERIDES: 90 mg/dL (ref <150)
Total CHOL/HDL Ratio: 3.4 Ratio
VLDL: 18 mg/dL (ref 0–40)

## 2014-12-21 LAB — COMPREHENSIVE METABOLIC PANEL
ALBUMIN: 4.6 g/dL (ref 3.5–5.2)
ALK PHOS: 76 U/L (ref 39–117)
ALT: 28 U/L (ref 0–53)
AST: 22 U/L (ref 0–37)
BUN: 18 mg/dL (ref 6–23)
CALCIUM: 10 mg/dL (ref 8.4–10.5)
CHLORIDE: 102 meq/L (ref 96–112)
CO2: 28 mEq/L (ref 19–32)
Creat: 1.01 mg/dL (ref 0.50–1.35)
Glucose, Bld: 91 mg/dL (ref 70–99)
Potassium: 4.1 mEq/L (ref 3.5–5.3)
Sodium: 139 mEq/L (ref 135–145)
TOTAL PROTEIN: 7.1 g/dL (ref 6.0–8.3)
Total Bilirubin: 0.6 mg/dL (ref 0.2–1.2)

## 2014-12-21 LAB — CBC WITH DIFFERENTIAL/PLATELET
Basophils Absolute: 0 10*3/uL (ref 0.0–0.1)
Basophils Relative: 0 % (ref 0–1)
Eosinophils Absolute: 0.4 10*3/uL (ref 0.0–0.7)
Eosinophils Relative: 4 % (ref 0–5)
HCT: 47.7 % (ref 39.0–52.0)
HEMOGLOBIN: 15.7 g/dL (ref 13.0–17.0)
Lymphocytes Relative: 22 % (ref 12–46)
Lymphs Abs: 1.9 10*3/uL (ref 0.7–4.0)
MCH: 32 pg (ref 26.0–34.0)
MCHC: 32.9 g/dL (ref 30.0–36.0)
MCV: 97.1 fL (ref 78.0–100.0)
MONO ABS: 0.6 10*3/uL (ref 0.1–1.0)
MPV: 10.2 fL (ref 8.6–12.4)
Monocytes Relative: 7 % (ref 3–12)
NEUTROS PCT: 67 % (ref 43–77)
Neutro Abs: 5.9 10*3/uL (ref 1.7–7.7)
PLATELETS: 195 10*3/uL (ref 150–400)
RBC: 4.91 MIL/uL (ref 4.22–5.81)
RDW: 13.9 % (ref 11.5–15.5)
WBC: 8.8 10*3/uL (ref 4.0–10.5)

## 2014-12-21 LAB — TSH: TSH: 0.419 u[IU]/mL (ref 0.350–4.500)

## 2014-12-21 NOTE — Progress Notes (Signed)
Chief Complaint  Patient presents with  . Annual Exam    fasting annual exam, no concerns. Did not do eye exam as he has one scheduled next month at [facility].    Travis Baldwin is a 64 y.o. male who presents for a complete physical.  He has the following concerns:  Hyperlipidemia follow-up: Patient is reportedly following a low-fat, low cholesterol diet. Compliant with taking lipitor, along with CoQ10 and denies medication side effects.  He continues to take [facility] for weakened urine stream, and that has improved, is somewhat intermittent now.  He is up once a night to void.  Smoking--he continues to smoke, as does his wife. He is down to 1/2 PPD since he can no longer smoke at work. He reports that he has quit x 1-3 weeks in the past.  Immunization History  Administered Date(s) Administered  . Influenza Split 09/09/2011, 10/09/2012  . Influenza Whole 09/09/1989, 12/05/1999  . Influenza,inj,Quad PF,36+ Mos 09/29/2013  . PPD Test 09/10/1995  . Pneumococcal Polysaccharide-23 09/10/1995, 09/09/2011  . Td 11/23/1997  . Tdap 01/10/2009   Last colonoscopy: 07/2013 Last PSA: 09/2013 Exercise: walking on the job, walks the dog daily (10-15 minutes twice daily) Ophtho: yearly. Wears one contact for reading and one for distance. Declines eye exam today Dentist: wears dentures, went to dentist last year for oral exam.  Past Medical History  Diagnosis Date  . Hyperlipidemia   . Impaired fasting glucose 02/2006  . Smoker   . Colon polyp     hyperplastic, adenomatous (2008); Dr. Carlean Purl  . Diverticulosis   . Microscopic hematuria     followed by [facility]; normal cystoscopy 05/2009  . Internal hemorrhoids Grade 2 prolapsing with bleeding 09/03/2013    Past Surgical History  Procedure Laterality Date  . Chest tube insertion  1996    pockets of infection on outside of lung  . Colonoscopy  multiple; 07/2013    Dr. Carlean Purl  . Hemorrhoid banding  09/2013    Dr. Carlean Purl     History   Social History  . Marital Status: Married    Spouse Name: Clarene Critchley    Number of Children: 0  . Years of Education: N/A   Occupational History  . 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 Smoking status: Current Every Day Smoker -- 0.50 packs/day for 39 years    Types: Cigarettes  . Smokeless tobacco: Never Used     Comment: smokes 1/2-3/4 PPD  . Alcohol Use: 6.0 oz/week    10 Shots of liquor per week     Comment: 1 drink each night (rum and coke), 5x/week, 1-2 shots per drink.  . Drug Use: No  . Sexual Activity:    Partners: Female   Other Topics Concern  . Not on file   Social History Narrative   Married, lives with wife, 1 dog    Family History  Problem Relation Age of Onset  . Hypertension Mother   . Hyperlipidemia Mother   . Cancer Father     bone (jaw)  . Cancer Brother     sarcoma  . Colon cancer Brother     sarcoma around colon  . Diabetes Maternal Aunt   . Diabetes Maternal Grandfather     Outpatient Encounter Prescriptions as of 12/21/2014  Medication Sig Note  . atorvastatin (LIPITOR) 20 MG tablet TAKE 1 TABLET BY MOUTH DAILY   . co-enzyme Q-10 30 MG capsule Take 30 mg by  mouth daily.    . Multiple Vitamins-Minerals (MULTIVITAMIN WITH MINERALS) tablet Take 1 tablet by mouth daily.   . Saw Palmetto, Serenoa repens, (SAW PALMETTO PO) Take 2 tablets by mouth daily.   . Aspirin-Salicylamide-Caffeine (BC HEADACHE POWDER PO) Take 1 packet by mouth as needed.   06/01/2014: Not taking; uses occasionally prn headache  . ibuprofen (ADVIL,MOTRIN) 800 MG tablet Take 800 mg by mouth every 8 (eight) hours as needed.   06/01/2014: Took recently due to back strain; none in the last 2 days  . [DISCONTINUED] atorvastatin (LIPITOR) 20 MG tablet TAKE 1 TABLET BY MOUTH DAILY     Allergies  Allergen Reactions  . Codeine Hives  . Vicodin [Hydrocodone-Acetaminophen] Other (See Comments)    Shakiness.   ROS: The patient  denies anorexia, fever, weight changes, headaches, vision loss (wears 2 different types of contacts--1 for reading, other for distance), decreased hearing, ear pain, hoarseness, chest pain, palpitations, dizziness, syncope, dyspnea on exertion, cough, swelling, nausea, vomiting, diarrhea, constipation, abdominal pain, melena, indigestion/heartburn, gross hematuria, incontinence, erectile dysfunction, nocturia, genital lesions, numbness, tingling, weakness, tremor, suspicious skin lesions, depression, anxiety, abnormal bleeding/bruising, or enlarged lymph nodes.Has had some intermittent hemorrhoidal bleeding since the banding, but much improved, infrequent. Left shoulder pain when reaching overhead has resolved. Urinary symptoms have improved with the use of saw palmetto. Feels like bladder empties completely. Up just once per night to void.   PHYSICAL EXAM:  BP 132/86 mmHg  Pulse 60  Ht _0  (1.753 m)  Wt 159 lb (72.122 kg)  BMI 23.47 kg/m2 120/74 on repeat by MD, RA General Appearance:  Alert, cooperative, no distress, appears stated age   Head:  Normocephalic, without obvious abnormality, atraumatic   Eyes:  PERRL, conjunctiva/corneas clear, EOM's intact, fundi  benign   Ears:  Normal TM's and external ear canals. 4-5 mm subcutaneous mobile cyst behind L ear, white and superficial in appearance   Nose:  Nares normal, mucosa normal, no drainage or sinus tenderness   Throat:  Lips, mucosa, and tongue normal; wearing upper and lower dentures. No mucosal lesions  Neck:  Supple, no lymphadenopathy; thyroid: no enlargement/tenderness/nodules; no carotid  bruit or JVD   Back:  Spine nontender, no curvature, ROM normal, no CVA tenderness   Lungs:  Clear to auscultation bilaterally without wheezes, rales or ronchi; respirations unlabored   Chest Wall:  No tenderness or deformity   Heart:  Regular rate and rhythm, S1 and S2 normal, no murmur, rub  or gallop   Breast  Exam:  No chest wall tenderness, masses or gynecomastia   Abdomen:  Soft, non-tender, nondistended, normoactive bowel sounds,  no masses, no hepatosplenomegaly   Genitalia:  Normal male external genitalia without lesions. Testicles without masses. No inguinal hernias.   Rectal:  Normal sphincter tone, no masses or tenderness; guaiac positive, light brown stool (he reports currently hemorrhoids are flaring. Prostate smooth, no nodules, normal size   Extremities:  No clubbing, cyanosis or edema   Pulses:  2+ and symmetric all extremities   Skin:  Skin color, texture, turgor normal, no rashes or lesions. Mole R cheek oddly shaped (like L-quote), unchanged, uniform color   Lymph nodes:  Cervical, supraclavicular, and axillary nodes normal   Neurologic:  CNII-XII intact, normal strength, sensation and gait; reflexes 2+ and symmetric throughout    Psych: Normal mood, affect, hygiene and grooming.    ASSESSMENT/PLAN:  Annual physical exam - Plan: Comprehensive metabolic panel, CBC with Differential, TSH, PSA, Lipid panel, POCT Urinalysis  Dipstick  Pure hypercholesterolemia - Plan: Comprehensive metabolic panel, Lipid panel  Tobacco use disorder - he admits he is not ready to quit. reviewed risks, available resources  Screening for prostate cancer - Plan: PSA  Other fatigue - Plan: Comprehensive metabolic panel, CBC with Differential, TSH  Need for prophylactic vaccination and inoculation against influenza - Plan: Flu Vaccine QUAD 36+ mos PF IM (Fluarix Quad PF)  Need for shingles vaccine - Plan: Varicella-zoster vaccine subcutaneous   PSA screening, risks/benefits reviewed, recommended at least 30 minutes of aerobic activity at least 5 days/week; proper sunscreen use reviewed; healthy diet and alcohol recommendations (less than or equal to 2 drinks/day) reviewed; regular seatbelt use; changing batteries in smoke detectors. Self-testicular exams.  Immunization recommendations discussed--flu shot and. Zostavax given.  Risks and side effects reviewed. Colonoscopy recommendations reviewed--UTD.  Counseled regarding smoking cessation   Flu shot zostavax c-met, lipid, PSA, CBC, TSH  6 month med check If labs are absolutely perfect, consider going full year.

## 2014-12-21 NOTE — Patient Instructions (Signed)
  HEALTH MAINTENANCE RECOMMENDATIONS:  It is recommended that you get at least 30 minutes of aerobic exercise at least 5 days/week (for weight loss, you may need as much as 60-90 minutes). This can be any activity that gets your heart rate up. This can be divided in 10-15 minute intervals if needed, but try and build up your endurance at least once a week.  Weight bearing exercise is also recommended twice weekly.  Eat a healthy diet with lots of vegetables, fruits and fiber.  "Colorful" foods have a lot of vitamins (ie green vegetables, tomatoes, red peppers, etc).  Limit sweet tea, regular sodas and alcoholic beverages, all of which has a lot of calories and sugar.  Up to 2 alcoholic drinks daily may be beneficial for men (unless trying to lose weight, watch sugars).  Drink a lot of water.  Sunscreen of at least SPF 30 should be used on all sun-exposed parts of the skin when outside between the hours of 10 am and 4 pm (not just when at beach or pool, but even with exercise, golf, tennis, and yard work!)  Use a sunscreen that says "broad spectrum" so it covers both UVA and UVB rays, and make sure to reapply every 1-2 hours.  Remember to change the batteries in your smoke detectors when changing your clock times in the spring and fall.  Use your seat belt every time you are in a car, and please drive safely and not be distracted with cell phones and texting while driving.    Please try and quit smoking--start thinking about why/when you smoke (habit, boredom, stress) in order to come up with effective strategies to cut back or quit. Available resources to help you quit include free counseling through [facility] Quitline ([place]Quitline[facility]com or 1-800-QUITNOW), smoking cessation classes through [facility] (call to find out schedule), over-the-counter nicotine replacements, and e-cigarettes (although this may not help break the hand-mouth habit).  Many insurance companies also have smoking  cessation programs (which may decrease the cost of patches, meds if enrolled).  If these methods are not effective for you, and you are motivated to quit, return to discuss the possibility of prescription medications.  PLEASE TRY AND QUIT SMOKING, TOGETHER WITH YOUR WIFE!

## 2014-12-22 ENCOUNTER — Encounter: Payer: Self-pay | Admitting: Family Medicine

## 2014-12-22 LAB — PSA: PSA: 2.51 ng/mL (ref <=4.00)

## 2015-03-22 ENCOUNTER — Other Ambulatory Visit: Payer: Self-pay

## 2015-04-13 ENCOUNTER — Encounter: Payer: BC Managed Care – PPO | Admitting: Family Medicine

## 2015-10-11 ENCOUNTER — Other Ambulatory Visit: Payer: Self-pay | Admitting: Family Medicine

## 2015-11-14 ENCOUNTER — Other Ambulatory Visit: Payer: Self-pay | Admitting: Family Medicine

## 2015-11-14 NOTE — Telephone Encounter (Signed)
Patient has an appt 11/16/15. Will refill for 30 days

## 2015-11-16 ENCOUNTER — Ambulatory Visit (INDEPENDENT_AMBULATORY_CARE_PROVIDER_SITE_OTHER): Payer: BLUE CROSS/BLUE SHIELD | Admitting: Family Medicine

## 2015-11-16 ENCOUNTER — Encounter: Payer: Self-pay | Admitting: Family Medicine

## 2015-11-16 VITALS — BP 118/80 | HR 68 | Ht 69.0 in | Wt 160.8 lb

## 2015-11-16 DIAGNOSIS — F17200 Nicotine dependence, unspecified, uncomplicated: Secondary | ICD-10-CM | POA: Diagnosis not present

## 2015-11-16 DIAGNOSIS — Z1159 Encounter for screening for other viral diseases: Secondary | ICD-10-CM | POA: Diagnosis not present

## 2015-11-16 DIAGNOSIS — Z23 Encounter for immunization: Secondary | ICD-10-CM | POA: Diagnosis not present

## 2015-11-16 DIAGNOSIS — E7800 Pure hypercholesterolemia, unspecified: Secondary | ICD-10-CM

## 2015-11-16 LAB — HEPATIC FUNCTION PANEL
ALK PHOS: 57 U/L (ref 40–115)
ALT: 18 U/L (ref 9–46)
AST: 20 U/L (ref 10–35)
Albumin: 4.2 g/dL (ref 3.6–5.1)
BILIRUBIN DIRECT: 0.1 mg/dL (ref <=0.2)
BILIRUBIN INDIRECT: 0.5 mg/dL (ref 0.2–1.2)
TOTAL PROTEIN: 7 g/dL (ref 6.1–8.1)
Total Bilirubin: 0.6 mg/dL (ref 0.2–1.2)

## 2015-11-16 LAB — LIPID PANEL
Cholesterol: 149 mg/dL (ref 125–200)
HDL: 55 mg/dL (ref >=40)
LDL CALC: 83 mg/dL (ref <130)
Total CHOL/HDL Ratio: 2.7 Ratio (ref <=5.0)
Triglycerides: 56 mg/dL (ref <150)
VLDL: 11 mg/dL (ref <30)

## 2015-11-16 MED ORDER — ATORVASTATIN CALCIUM 20 MG PO TABS: 20.0000 mg | ORAL_TABLET | Freq: Every day | ORAL | Status: DC

## 2015-11-16 NOTE — Patient Instructions (Signed)
Continue your current medications and low cholesterol diet. Please continue to work on trying to quit smoking.  If you were able to quit for 3 weeks, if you both keep trying, you will likely be successful.  I strongly encourage you to do it as a team effort.  Please try and quit smoking--start thinking about why/when you smoke (habit, boredom, stress) in order to come up with effective strategies to cut back or quit. Available resources to help you quit include free counseling through [facility] Quitline ([place]Quitline[facility]com or 1-800-QUITNOW), smoking cessation classes through [facility] (call to find out schedule), over-the-counter nicotine replacements, and e-cigarettes (although this may not help break the hand-mouth habit).  Many insurance companies also have smoking cessation programs (which may decrease the cost of patches, meds if enrolled).  If these methods are not effective for you, and you are motivated to quit, return to discuss the possibility of prescription medications.

## 2015-11-16 NOTE — Progress Notes (Signed)
Chief Complaint  Patient presents with  . Hyperlipidemia    fasting med check.    Hyperlipidemia follow-up: Patient is reportedly following a low-fat, low cholesterol diet. Compliant with taking lipitor, along with CoQ10 and denies medication side effects.  Smoking--he continues to smoke, as does his wife. He is down to 1/2 PPD since he can no longer smoke at work. He reports that he and his wife recently quit smoking for 3 weeks, after she had surgery.  PMH,PSH, SH and FH reviewed/updated.  Outpatient Encounter Prescriptions as of 11/16/2015  Medication Sig Note  . atorvastatin (LIPITOR) 20 MG tablet TAKE 1 TABLET BY MOUTH DAILY   . co-enzyme Q-10 30 MG capsule Take 30 mg by mouth daily.    . ferrous sulfate 325 (65 FE) MG tablet Take 325 mg by mouth daily with breakfast. 11/16/2015: Twice a week  . Multiple Vitamins-Minerals (MULTIVITAMIN WITH MINERALS) tablet Take 1 tablet by mouth daily.   . Saw Palmetto, Serenoa repens, (SAW PALMETTO PO) Take 2 tablets by mouth daily.   . Aspirin-Salicylamide-Caffeine (BC HEADACHE POWDER PO) Take 1 packet by mouth as needed.   06/01/2014: Not taking; uses occasionally prn headache  . ibuprofen (ADVIL,MOTRIN) 800 MG tablet Take 800 mg by mouth every 8 (eight) hours as needed.   11/16/2015: Uses prn, not in the last month  . [DISCONTINUED] atorvastatin (LIPITOR) 20 MG tablet TAKE 1 TABLET BY MOUTH DAILY    No facility-administered encounter medications on file as of 11/16/2015.   Allergies  Allergen Reactions  . Codeine Hives  . Vicodin [Hydrocodone-Acetaminophen] Other (See Comments)    Shakiness.    Immunization History  Administered Date(s) Administered  . Influenza Split 09/09/2011, 10/09/2012  . Influenza Whole 09/09/1989, 12/05/1999  . Influenza,inj,Quad PF,36+ Mos 09/29/2013, 12/21/2014, 11/16/2015  . PPD Test 09/10/1995  . Pneumococcal Polysaccharide-23 09/10/1995, 09/09/2011  . Td 11/23/1997  . Tdap 01/10/2009  . Zoster 12/21/2014    ROS:  No fever, chills, URI symptoms, headaches, dizziness, chest pain, shortness of breath, myalgias, arthralgias.  Back pain resolved since he bought a new temperpedic mattress. Denies nausea, vomiting, heartburn, bowel changes, urinary complaints, bleeding, bruising, rash, depression or any other concerns.   PHYSICAL EXAM: BP 118/80 mmHg  Pulse 68  Ht 5\' 9"  (1.753 m)  Wt 160 lb 12.8 oz (72.938 kg)  BMI 23.74 kg/m2  Well developed, pleasant, thin male in no distress Neck: no lymphadenopathy, thyromegaly, mass or bruit Heart: regular rate and rhythm without murmur Lungs: clear bilaterally Back: no CVA tenderness or spinal tenderness Abdomen: soft, nontender, no organomegaly or mass Extremities: no edema Psych: normal mood, affect, hygiene and grooming Neuro: alert and oriented. Normal strength, sensation, gait and cranial nerves.  ASSESSMENT/PLAN:  Pure hypercholesterolemia - Plan: Lipid panel, Hepatic function panel, atorvastatin (LIPITOR) 20 MG tablet  Need for prophylactic vaccination and inoculation against influenza - Plan: Flu Vaccine QUAD 36+ mos PF IM (Fluarix & Fluzone Quad PF)  Tobacco use disorder  Need for hepatitis C screening test - Plan: Hepatitis C antibody   prevnar-13 at age 78; will schedule his next CPE for after age 47. He reports he will still be working, insurance will remain the same, not changing to Medicare.  Flu shot given today.  Counseled extensively re: tobacco cessation--encouraged him and his wife to try again, and support each other. Info given.  lft's and lipids today, and Hep C screen Had HIV screen done through life insurance policy a few years ago.  Schedule CPE for  early October--this way he will be 56 and can get Prevnar. Prior labs reviewed, and thyroid and PSA has been normal, so okay to wait.  (too early to do PSA with labs today).

## 2015-11-17 ENCOUNTER — Encounter: Payer: Self-pay | Admitting: Family Medicine

## 2015-11-17 LAB — HEPATITIS C ANTIBODY: HCV Ab: NEGATIVE

## 2015-12-22 ENCOUNTER — Encounter: Payer: Self-pay | Admitting: Family Medicine

## 2015-12-22 ENCOUNTER — Ambulatory Visit (INDEPENDENT_AMBULATORY_CARE_PROVIDER_SITE_OTHER): Payer: BLUE CROSS/BLUE SHIELD | Admitting: Family Medicine

## 2015-12-22 VITALS — BP 140/80 | HR 64

## 2015-12-22 DIAGNOSIS — M5442 Lumbago with sciatica, left side: Secondary | ICD-10-CM | POA: Diagnosis not present

## 2015-12-22 MED ORDER — NAPROXEN 500 MG PO TABS: 500.0000 mg | ORAL_TABLET | Freq: Two times a day (BID) | ORAL | Status: DC

## 2015-12-22 MED ORDER — PREDNISONE 50 MG PO TABS: ORAL_TABLET | ORAL | Status: DC

## 2015-12-22 NOTE — Patient Instructions (Signed)
Use heat to the area 20 minutes at a time 2-3 times daily. Take the naproxen 1 pill twice daily with food and the steroid as prescribed.   Sciatica Sciatica is pain, weakness, numbness, or tingling along the path of the sciatic nerve. The nerve starts in the lower back and runs down the back of each leg. The nerve controls the muscles in the lower leg and in the back of the knee, while also providing sensation to the back of the thigh, lower leg, and the sole of your foot. Sciatica is a symptom of another medical condition. For instance, nerve damage or certain conditions, such as a herniated disk or bone spur on the spine, pinch or put pressure on the sciatic nerve. This causes the pain, weakness, or other sensations normally associated with sciatica. Generally, sciatica only affects one side of the body. CAUSES   Herniated or slipped disc.  Degenerative disk disease.  A pain disorder involving the narrow muscle in the buttocks (piriformis syndrome).  Pelvic injury or fracture.  Pregnancy.  Tumor (rare). SYMPTOMS  Symptoms can vary from mild to very severe. The symptoms usually travel from the low back to the buttocks and down the back of the leg. Symptoms can include:  Mild tingling or dull aches in the lower back, leg, or hip.  Numbness in the back of the calf or sole of the foot.  Burning sensations in the lower back, leg, or hip.  Sharp pains in the lower back, leg, or hip.  Leg weakness.  Severe back pain inhibiting movement. These symptoms may get worse with coughing, sneezing, laughing, or prolonged sitting or standing. Also, being overweight may worsen symptoms. DIAGNOSIS  Your caregiver will perform a physical exam to look for common symptoms of sciatica. He or she may ask you to do certain movements or activities that would trigger sciatic nerve pain. Other tests may be performed to find the cause of the sciatica. These may include:  Blood tests.  X-rays.  Imaging  tests, such as an MRI or CT scan. TREATMENT  Treatment is directed at the cause of the sciatic pain. Sometimes, treatment is not necessary and the pain and discomfort goes away on its own. If treatment is needed, your caregiver may suggest:  Over-the-counter medicines to relieve pain.  Prescription medicines, such as anti-inflammatory medicine, muscle relaxants, or narcotics.  Applying heat or ice to the painful area.  Steroid injections to lessen pain, irritation, and inflammation around the nerve.  Reducing activity during periods of pain.  Exercising and stretching to strengthen your abdomen and improve flexibility of your spine. Your caregiver may suggest losing weight if the extra weight makes the back pain worse.  Physical therapy.  Surgery to eliminate what is pressing or pinching the nerve, such as a bone spur or part of a herniated disk. HOME CARE INSTRUCTIONS   Only take over-the-counter or prescription medicines for pain or discomfort as directed by your caregiver.  Apply ice to the affected area for 20 minutes, 3-4 times a day for the first 48-72 hours. Then try heat in the same way.  Exercise, stretch, or perform your usual activities if these do not aggravate your pain.  Attend physical therapy sessions as directed by your caregiver.  Keep all follow-up appointments as directed by your caregiver.  Do not wear high heels or shoes that do not provide proper support.  Check your mattress to see if it is too soft. A firm mattress may lessen your pain  and discomfort. SEEK IMMEDIATE MEDICAL CARE IF:   You lose control of your bowel or bladder (incontinence).  You have increasing weakness in the lower back, pelvis, buttocks, or legs.  You have redness or swelling of your back.  You have a burning sensation when you urinate.  You have pain that gets worse when you lie down or awakens you at night.  Your pain is worse than you have experienced in the past.  Your  pain is lasting longer than 4 weeks.  You are suddenly losing weight without reason. MAKE SURE YOU:  Understand these instructions.  Will watch your condition.  Will get help right away if you are not doing well or get worse.   This information is not intended to replace advice given to you by your health care provider. Make sure you discuss any questions you have with your health care provider.   Document Released: 11/19/2001 Document Revised: 08/16/2015 Document Reviewed: 04/05/2012 Elsevier Interactive Patient Education [employer].

## 2015-12-22 NOTE — Progress Notes (Signed)
   Subjective:    Patient ID: Travis Baldwin, male    DOB: 01/18/1951, 65 y.o.   MRN: [id number]  HPI Chief Complaint  Patient presents with  . pain in sciatia    pain in sciatia area. having pain down left side. having trouble walking   He is here with complaints of a 2 week history of left low back pain that radiates into his left buttock and shoots down his left leg to the top of his ankle. States pain is intermittent. He states pain started when he was lifting a box at work. Sitting improves pain and standing and walking worsens pain. He occasionally notices tingling sensation down left leg.  He thinks he is having sciatic nerve problem, thinks he has had this in past. He has taken ibuprofen 800 mg one time for 3-4 days and also tried Naproxen 400 mg once daily x 1 day.  Denies back pain otherwise.  Reports history of back pain but states this problem is different.   Denies fever, chills, unexplained weight loss, fatigue bowel or bladder issues, numbness to perineal area, no numbness or weakness to lower extremities. No significant history of steroid use.  He is immunocompetent. Past Medical History  Diagnosis Date  . Hyperlipidemia   . Impaired fasting glucose 02/2006  . Smoker   . Colon polyp     hyperplastic, adenomatous (2008); Dr. Carlean Purl  . Diverticulosis   . Microscopic hematuria     followed by [facility]; normal cystoscopy 05/2009  . Internal hemorrhoids Grade 2 prolapsing with bleeding 09/03/2013     Review of Systems Pertinent positives and negatives in the history of present illness.    Objective:   Physical Exam  Constitutional: He appears well-developed and well-nourished. No distress.  Neck: Normal range of motion and full passive range of motion without pain. Neck supple.  Musculoskeletal:       Lumbar back: He exhibits tenderness and pain. He exhibits normal range of motion, no swelling and no spasm.  No erythema, bruising, obvious deformity, or rash to back.  Full ROM, pain with back flexion and right lateral bending and right rotation. Tenderness over sacroiliac joint. Normal sensation, strength to bilateral LE. DTRs normal and symmetric at patella and achilles. Negative straight leg.   BP 140/80 mmHg  Pulse 64      Assessment & Plan:  Left-sided low back pain with left-sided sciatica - Plan: naproxen (NAPROSYN) 500 MG tablet, predniSONE (DELTASONE) 50 MG tablet  Discussed that his pain appears to be related to sciatica and piriformis syndrome. Recommend take an anti-inflammatory, he states he prefers naproxen. Prescription sent to pharmacy. Also recommend a short course of steroids since he has taken anti-inflammatories without relief over the past few days. Also recommend using heat to the area and performing stretches. He states he knows which stretches to do and that he has done them in the past. Recommend that if he is not improving in the next 2-3 days to follow-up.

## 2015-12-25 ENCOUNTER — Telehealth: Payer: Self-pay

## 2015-12-25 MED ORDER — CYCLOBENZAPRINE HCL 10 MG PO TABS: 10.0000 mg | ORAL_TABLET | Freq: Three times a day (TID) | ORAL | Status: DC | PRN

## 2015-12-25 NOTE — Telephone Encounter (Signed)
Sent in med to pharmacy 

## 2015-12-25 NOTE — Telephone Encounter (Signed)
We can send in a prescription for Flexeril 10 mg 3 times a day and see if this helps. #20 no refills.

## 2015-12-25 NOTE — Telephone Encounter (Signed)
Pt states that he is not doing any better. He can not work along time due to the pain and walking. He wants to know if you can prescribe him something stronger for pain so he can work. He has one day of prednisone left but not really showing any improvement

## 2015-12-25 NOTE — Telephone Encounter (Signed)
Pt called to let you know how he was doing. He isn't doing any better. It's not getting worse but it isn't any better than when he was in to see you for this issue.

## 2015-12-25 NOTE — Telephone Encounter (Signed)
Call and let him know that I think we should give this a little more time. Is he still taking the prednisone? If he doesn't notice any improvement in next 2-3 days he may need to be seen again. Is he using heat and doing the stretches?

## 2015-12-28 ENCOUNTER — Ambulatory Visit (INDEPENDENT_AMBULATORY_CARE_PROVIDER_SITE_OTHER): Payer: BLUE CROSS/BLUE SHIELD | Admitting: Family Medicine

## 2015-12-28 ENCOUNTER — Encounter: Payer: Self-pay | Admitting: Family Medicine

## 2015-12-28 VITALS — BP 146/76 | HR 68 | Ht 69.0 in | Wt 162.2 lb

## 2015-12-28 DIAGNOSIS — M79605 Pain in left leg: Secondary | ICD-10-CM

## 2015-12-28 DIAGNOSIS — G5702 Lesion of sciatic nerve, left lower limb: Secondary | ICD-10-CM | POA: Diagnosis not present

## 2015-12-28 MED ORDER — TRAMADOL HCL 50 MG PO TABS: 50.0000 mg | ORAL_TABLET | Freq: Four times a day (QID) | ORAL | Status: DC | PRN

## 2015-12-28 NOTE — Patient Instructions (Signed)
Continue the naproxen twice daily with food. Continue the muscle relaxants, since they aren't making you drowsy. Continue the stretches as shown (both for the hip/buttock, and for the hamstring and calf).  You are scheduled for an appointment with chiropractor at [facility] for Monday morning, 1/23 at 8am.  Arrive 5-10 minutes early to fill out paperwork.  [address] (same shopping center as [employer], [employer])  Their number is [phone]

## 2015-12-28 NOTE — Progress Notes (Signed)
Chief Complaint  Patient presents with  . Leg Pain    left sided leg pain, saw Vickie last Fri. Feels like he pulled something in his left hip 3 weeks ago at work bending over to pick up a box. Pain in mostly in his left buttock and down the back of his left leg and somewhat in his left calf area. Was not hurting while sitting last Friday, began to hurt while sitting Wed. Did not go to work Monday, did go Wed but he was in so much pain he left. Leg pain is not worse than when he saw Vickie Friday but def not better.     Seen by Loletha Carrow 1/13 with 2 weeks of low back pain, radiating down his left leg. Felt to be related to sciatica and piriformis syndrome. He was given a course of prednisone, 50mg  daily x 5 days. He called back 1/16 due to ongoing pain, and Flexeril was prescribed. These don't seem to help either.  He took the last prednisone 2 days ago. Pain had improved some on 1/16 in the afternoon, so he went to work on 1/17.  He had some discomfort while working on 1/17, pain was worse that evening.  He used heating pad, flexeril, and went to work the next day (yesterday).  He had pain upon awakening, didn't get much benefit from the treatments the night before.  He was unable to stay at work due to discomfort.    He had been taking prescription naproxen twice daily, even when on the steroids, and has continued to take these.  He took a flexeril this morning--doesn't seem to be too helpful. Hasn't used any other pain medication.  He is complaining of soreness in the posterior thigh, behind the left knee, and in the calf. Pain starts at the left buttock area.  Sitting in the office, pain is 2/10.  With standing/walking, pain is 8-9/10.  Needs to stop and rest with walking.  When it first started, he had pain on the left, switched for one day to the right, and has been back on the left side since then.  He had acute onset of pain, like a strain, when he was lifting a box at work--felt the strain at  his buttock/hip, not in the back.  Has had this in the past, but usually resolves on its own, and this time it hasn't been getting better.  PMH, PSH, SH reviewed  Outpatient Encounter Prescriptions as of 12/28/2015  Medication Sig Note  . atorvastatin (LIPITOR) 20 MG tablet Take 1 tablet (20 mg total) by mouth daily.   Marland Kitchen co-enzyme Q-10 30 MG capsule Take 30 mg by mouth daily.    . cyclobenzaprine (FLEXERIL) 10 MG tablet Take 1 tablet (10 mg total) by mouth 3 (three) times daily as needed for muscle spasms.   . Multiple Vitamins-Minerals (MULTIVITAMIN WITH MINERALS) tablet Take 1 tablet by mouth daily.   . naproxen (NAPROSYN) 500 MG tablet Take 1 tablet (500 mg total) by mouth 2 (two) times daily with a meal.   . Saw Palmetto, Serenoa repens, (SAW PALMETTO PO) Take 2 tablets by mouth daily.   . [DISCONTINUED] ferrous sulfate 325 (65 FE) MG tablet Take 325 mg by mouth daily with breakfast. Reported on 12/22/2015 11/16/2015: Twice a week  . Aspirin-Salicylamide-Caffeine (BC HEADACHE POWDER PO) Take 1 packet by mouth as needed. Reported on 12/28/2015 06/01/2014: Not taking; uses occasionally prn headache  . ibuprofen (ADVIL,MOTRIN) 800 MG tablet Take 800 mg by  mouth every 8 (eight) hours as needed. Reported on 12/28/2015 11/16/2015: Uses prn, not in the last month  . [DISCONTINUED] predniSONE (DELTASONE) 50 MG tablet Take 1 tablet daily in the morning (Patient not taking: Reported on 12/28/2015)    No facility-administered encounter medications on file as of 12/28/2015.   Allergies  Allergen Reactions  . Codeine Hives  . Vicodin [Hydrocodone-Acetaminophen] Other (See Comments)    Shakiness.   ROS: He denies any back pain, urine/bowel problems, fevers, chills, weakness.  Denies numbness, tingling. Denies any swelling of the leg. No bleeding, bruising, rash, URI symptoms, shortness of breath, chest pan or other concerns.  PHYSICAL EXAM:  BP 146/76 mmHg  Pulse 68  Ht 5\' 9"  (1.753 m)  Wt 162 lb 3.2 oz  (73.573 kg)  BMI 23.94 kg/m2  Well developed, pleasant male, who isn't in any discomfort/distress during visit. Back: Spine nontender. No CVA tenderness SI joint nontender. Mildly tender over lower sacrum, and into the left buttock.  Tender at sciatic notch and pyriformis muscle.  Decreased ROM and pain with pyriformis stretch. DTR's 2+, normal strength, normal gait Has pain into the hamstring with SLR bilaterally (with hamstring stretch). He is mildly tender over the hamstring and calf; there is no edema or asymmetry. Psych: normal mood, affect, hygiene and grooming.  ASSESSMENT/PLAN:  Pyriformis syndrome, left  Left leg pain - Plan: traMADol (ULTRAM) 50 MG tablet   Referred to Dr. Johnston Ebbs, appointment Monday morning (chiro/ART). Continue flexeril, naproxen. Shown additional pyriformis stretches, hamstring stretches. Try heat/massage Risks/side effects of tramadol reviewed

## 2016-01-03 ENCOUNTER — Telehealth: Payer: Self-pay | Admitting: Family Medicine

## 2016-01-03 DIAGNOSIS — M79605 Pain in left leg: Secondary | ICD-10-CM

## 2016-01-03 MED ORDER — TRAMADOL HCL 50 MG PO TABS: 50.0000 mg | ORAL_TABLET | Freq: Four times a day (QID) | ORAL | Status: DC | PRN

## 2016-01-03 NOTE — Telephone Encounter (Signed)
Spoke with Lavetta Nielsen, patient's wife and she said she did not say anything about a specialist when she called. Patient is being seen at [facility], seen Mon and Wed (not Ysidro Evert, the other doc). Monday was a lot easier than today-has been doing stretches, massage, heat and what sounds to be e-stim? Was in a lot more pain today and would like the tramadol (which I will call in) just wanted to let you know about the prednisone. States that it did not work very well when previously rx'd but thought that maybe now since he is moving around and seeing chiro that it may help and wanted to try again. Please advise.

## 2016-01-03 NOTE — Telephone Encounter (Signed)
Pt would like a refill on Tramadol 50mg  and also wants to know if he can get steroids sent to pharmacy again to help him out until he gets to specialist. Call wife Helene Kelp @ [phone] when Tramadol ready for pick up and to let her know if steroid refill is approved.

## 2016-01-03 NOTE — Telephone Encounter (Signed)
It didn't sound to me like he got any benefit from the first course of steroids that was prescribed by Vickie, which is why I'm hesitant. What specialist are they planning to see, and when is appointment?  Did he see the chiro Monday morning--how did that go?  Okay to refill tramadol

## 2016-01-03 NOTE — Telephone Encounter (Signed)
Patient advised and phoned in tramadol #20.

## 2016-01-03 NOTE — Telephone Encounter (Signed)
I don't think that it will work any better now than it did the first time.  Use the tramadol as needed for pain, continue with chiro.  He can usually get an idea if a steroid might be beneficial as well (chiro), depending on what his diagnosis is.  Hold off for now

## 2016-01-08 ENCOUNTER — Other Ambulatory Visit: Payer: Self-pay | Admitting: Family Medicine

## 2016-01-08 ENCOUNTER — Encounter: Payer: Self-pay | Admitting: *Deleted

## 2016-01-08 ENCOUNTER — Ambulatory Visit (INDEPENDENT_AMBULATORY_CARE_PROVIDER_SITE_OTHER): Payer: BLUE CROSS/BLUE SHIELD | Admitting: Family Medicine

## 2016-01-08 ENCOUNTER — Telehealth: Payer: Self-pay | Admitting: *Deleted

## 2016-01-08 ENCOUNTER — Encounter: Payer: Self-pay | Admitting: Family Medicine

## 2016-01-08 VITALS — BP 142/92 | HR 88 | Ht 69.0 in | Wt 157.0 lb

## 2016-01-08 DIAGNOSIS — Z139 Encounter for screening, unspecified: Secondary | ICD-10-CM

## 2016-01-08 DIAGNOSIS — M79605 Pain in left leg: Secondary | ICD-10-CM

## 2016-01-08 DIAGNOSIS — M5417 Radiculopathy, lumbosacral region: Secondary | ICD-10-CM | POA: Diagnosis not present

## 2016-01-08 MED ORDER — METHYLPREDNISOLONE ACETATE 80 MG/ML IJ SUSP
80.0000 mg | Freq: Once | INTRAMUSCULAR | Status: AC
  Administered 2016-01-08: 80 mg via INTRAMUSCULAR

## 2016-01-08 MED ORDER — TRAMADOL HCL 50 MG PO TABS
50.0000 mg | ORAL_TABLET | Freq: Four times a day (QID) | ORAL | Status: DC | PRN
Start: 2016-01-08 — End: 2016-01-15

## 2016-01-08 NOTE — Progress Notes (Signed)
Chief Complaint  Patient presents with  . Follow-up    his leg his worse, more painful then when he first came in. Hurts even sitting and laying down. Went to [facility] and feels like they really did not do "deep tissue massage,' only massaged for 5 minutes, not sure if this is enough time. Left side of foot is now numb and ankle is painful. Saw Dr.Eva Chancy [place] at [facility].   He has had 2 sessions with Jessy Oto (chiro, ART) so far, and hasn't really noted any improvement yet.  He has since developed numbness in the lateral aspect of his left foot. Denies any weakness. He continues to have pain shooting down the back of his thigh, and down the leg. Previously his pain resolved with sitting and when laying in bed at night.  Now he continues to have pain even in those positions.  Pain is less, a dull ache, just enough to keep him awake.  He wasn't able to stay at work for longer than 2 hours on Saturday due to the pain.  He has been needing to take tramadol three times daily for pain relief.  He is asking for refill on tramadol. (he was just given #20 on 1/25).  He has been taking at least 3/day (1 in the morning, followed by heating pad; also tried whirlpool, without any help; takes 1 at lunch, 1 at bedtime).   PMH, [place] SH reviewed. Current Outpatient Prescriptions on File Prior to Visit  Medication Sig Dispense Refill  . atorvastatin (LIPITOR) 20 MG tablet Take 1 tablet (20 mg total) by mouth daily. 30 tablet 10  . co-enzyme Q-10 30 MG capsule Take 30 mg by mouth daily.     . Multiple Vitamins-Minerals (MULTIVITAMIN WITH MINERALS) tablet Take 1 tablet by mouth daily.    . Saw Palmetto, Serenoa repens, (SAW PALMETTO PO) Take 2 tablets by mouth daily.    . Aspirin-Salicylamide-Caffeine (BC HEADACHE POWDER PO) Take 1 packet by mouth as needed. Reported on 01/08/2016    . cyclobenzaprine (FLEXERIL) 10 MG tablet Take 1 tablet (10 mg total) by mouth 3 (three) times daily as needed for muscle spasms. (Patient not  taking: Reported on 01/08/2016) 20 tablet 0  . ibuprofen (ADVIL,MOTRIN) 800 MG tablet Take 800 mg by mouth every 8 (eight) hours as needed. Reported on 01/08/2016    . naproxen (NAPROSYN) 500 MG tablet Take 1 tablet (500 mg total) by mouth 2 (two) times daily with a meal. (Patient not taking: Reported on 01/08/2016) 30 tablet 0   No current facility-administered medications on file prior to visit.   Allergies  Allergen Reactions  . Codeine Hives  . Vicodin [Hydrocodone-Acetaminophen] Other (See Comments)    Shakiness.   ROS: no fever, chills, bowel/bladder dysfunction, bleeding, bruising. +numbness.  No weakness, no rash. No GI complaints. No chest pain, shortness of breath, edema, or other concerns.  PHYSICAL EXAM:  BP 142/92 mmHg  Pulse 88  Ht 5\' 9"  (1.753 m)  Wt 157 lb (71.215 kg)  BMI 23.17 kg/m2  Well developed, pleasant male in no distress Back: no spinal tenderness, no SI joint tenderness. He has tenderness at the left sciatic notch, and also mild tenderness at left lumbar paraspinous muscles. Neuro: normal strength, gait. DTR's 2+ at knees, 1+ R ankle, absent at L ankle. Skin: intact, no rash or lesions.  ASSESSMENT/PLAN:  Lumbosacral radiculopathy at S1 - Plan: MR Lumbar Spine Wo Contrast  Left leg pain - Plan: traMADol (ULTRAM) 50 MG tablet,  methylPREDNISolone acetate (DEPO-MEDROL) injection 80 mg  Screening - Plan: [facility] Eye Foreign Body    S1 radiculopathy--Ddx reviewed. Worsening despite one course of oral steroids, NSAIDs and 2 ART treatments. Depo Medrol 80mg  today Check MRI. Refill tramadol Use tylenol prn pain  Risks/side effects reviewed.

## 2016-01-08 NOTE — Patient Instructions (Signed)
We are going to schedule you for an MRI.  I suspect S1 nerve problem is causing your pain.  You were given a cortisone injection today to help with inflammation of the nerve, and hopefully with some of your pain and numbness in the foot. Do not use advil, Naproxen or other anti-inflammatories over the next few days. You may continue to use tramadol if needed for severe pain, and tylenol (plain or extra strength) for any other pain.

## 2016-01-08 NOTE — Telephone Encounter (Signed)
Patient called and states that when he got back to work today and told them that he needed an MRI and that he was scheduled this coming Friday he was told that this now has to be put through [facility]. His employer is asking for a "return to work" note faxed today to [phone] or he can not report to work tomorrow. I told him that Dr.Knapp never took him out of work. He said he knew, that he was just doing what he was told.

## 2016-01-08 NOTE — Telephone Encounter (Signed)
Letter written and faxed.

## 2016-01-08 NOTE — Telephone Encounter (Signed)
Since I never took him out of work, okay to write note stating he may return to work.

## 2016-01-12 ENCOUNTER — Ambulatory Visit
Admission: RE | Admit: 2016-01-12 | Discharge: 2016-01-12 | Disposition: A | Discharge status: Home / Self Care | Payer: BLUE CROSS/BLUE SHIELD | Source: Ambulatory Visit | Attending: Family Medicine | Admitting: Family Medicine

## 2016-01-12 ENCOUNTER — Telehealth: Payer: Self-pay | Admitting: *Deleted

## 2016-01-12 ENCOUNTER — Other Ambulatory Visit: Payer: Self-pay | Admitting: Family Medicine

## 2016-01-12 DIAGNOSIS — M5417 Radiculopathy, lumbosacral region: Secondary | ICD-10-CM

## 2016-01-12 DIAGNOSIS — Z77018 Contact with and (suspected) exposure to other hazardous metals: Secondary | ICD-10-CM

## 2016-01-12 DIAGNOSIS — Z139 Encounter for screening, unspecified: Secondary | ICD-10-CM

## 2016-01-12 NOTE — Telephone Encounter (Signed)
Dr [facility] called to inform that  Reports are ready for patient to be viewed

## 2016-01-12 NOTE — Telephone Encounter (Signed)
I have no idea if the metallic densities seen on films (related to dental work) are a contraindication to having an MRI or not.  Shouldn't they check with the radiologist? I'm assuming this was why they called-- The imaging I ordered was MRI, which doesn't look like it has been done, as they did some screening x-rays prior to the MRI.

## 2016-01-15 ENCOUNTER — Telehealth: Payer: Self-pay | Admitting: *Deleted

## 2016-01-15 DIAGNOSIS — M79605 Pain in left leg: Secondary | ICD-10-CM

## 2016-01-15 MED ORDER — METHYLPREDNISOLONE 4 MG PO TBPK: 4.0000 mg | ORAL_TABLET | ORAL | Status: DC

## 2016-01-15 MED ORDER — TRAMADOL HCL 50 MG PO TABS: 50.0000 mg | ORAL_TABLET | Freq: Four times a day (QID) | ORAL | Status: DC | PRN

## 2016-01-15 NOTE — Telephone Encounter (Signed)
He was given #30 on 1/30, meaning he is using 3-4/day.  That is a lot, to have to continue that until seen by neurosurgery, not knowing when appointment is.  Okay to refill #30, but also I recommend doing another steroid course--rx medrol dosepak

## 2016-01-15 NOTE — Telephone Encounter (Signed)
rx's sent to [place] [facility].

## 2016-01-15 NOTE — Telephone Encounter (Signed)
Patient called and I did referral to [facility](they have [place] fax and then they schedule). He said that he needs a refill on his tramadol.

## 2016-01-22 ENCOUNTER — Other Ambulatory Visit: Payer: Self-pay | Admitting: Neurosurgery

## 2016-01-22 DIAGNOSIS — M5126 Other intervertebral disc displacement, lumbar region: Secondary | ICD-10-CM

## 2016-01-23 ENCOUNTER — Ambulatory Visit
Admission: RE | Admit: 2016-01-23 | Discharge: 2016-01-23 | Disposition: A | Discharge status: Home / Self Care | Payer: BLUE CROSS/BLUE SHIELD | Source: Ambulatory Visit | Attending: Neurosurgery | Admitting: Neurosurgery

## 2016-01-23 DIAGNOSIS — M5126 Other intervertebral disc displacement, lumbar region: Secondary | ICD-10-CM

## 2016-01-23 MED ORDER — IOHEXOL 180 MG/ML  SOLN
1.0000 mL | Freq: Once | INTRAMUSCULAR | Status: AC | PRN
  Administered 2016-01-23: 1 mL via INTRAVENOUS

## 2016-01-23 MED ORDER — METHYLPREDNISOLONE ACETATE 40 MG/ML INJ SUSP (RADIOLOG
120.0000 mg | Freq: Once | INTRAMUSCULAR | Status: AC
  Administered 2016-01-23: 120 mg via EPIDURAL

## 2016-01-23 NOTE — Discharge Instructions (Signed)

## 2016-01-24 NOTE — Telephone Encounter (Signed)
I don't understand this message-called over [facility] and they have no idea why someone called here. According to chart MRI WAS done.

## 2016-02-07 ENCOUNTER — Other Ambulatory Visit: Payer: Self-pay | Admitting: Neurosurgery

## 2016-02-07 DIAGNOSIS — M5126 Other intervertebral disc displacement, lumbar region: Secondary | ICD-10-CM

## 2016-02-12 ENCOUNTER — Ambulatory Visit
Admission: RE | Admit: 2016-02-12 | Discharge: 2016-02-12 | Disposition: A | Discharge status: Home / Self Care | Payer: BLUE CROSS/BLUE SHIELD | Source: Ambulatory Visit | Attending: Neurosurgery | Admitting: Neurosurgery

## 2016-02-12 DIAGNOSIS — M5126 Other intervertebral disc displacement, lumbar region: Secondary | ICD-10-CM

## 2016-02-12 MED ORDER — METHYLPREDNISOLONE ACETATE 40 MG/ML INJ SUSP (RADIOLOG
120.0000 mg | Freq: Once | INTRAMUSCULAR | Status: AC
  Administered 2016-02-12: 120 mg via EPIDURAL

## 2016-02-12 MED ORDER — IOHEXOL 180 MG/ML  SOLN
1.0000 mL | Freq: Once | INTRAMUSCULAR | Status: AC | PRN
  Administered 2016-02-12: 1 mL via EPIDURAL

## 2016-03-01 ENCOUNTER — Other Ambulatory Visit: Payer: Self-pay | Admitting: Neurosurgery

## 2016-03-01 DIAGNOSIS — M5126 Other intervertebral disc displacement, lumbar region: Secondary | ICD-10-CM

## 2016-03-05 ENCOUNTER — Ambulatory Visit
Admission: RE | Admit: 2016-03-05 | Discharge: 2016-03-05 | Disposition: A | Discharge status: Home / Self Care | Payer: BLUE CROSS/BLUE SHIELD | Source: Ambulatory Visit | Attending: Neurosurgery | Admitting: Neurosurgery

## 2016-03-05 DIAGNOSIS — M5126 Other intervertebral disc displacement, lumbar region: Secondary | ICD-10-CM

## 2016-03-05 MED ORDER — METHYLPREDNISOLONE ACETATE 40 MG/ML INJ SUSP (RADIOLOG
120.0000 mg | Freq: Once | INTRAMUSCULAR | Status: AC
  Administered 2016-03-05: 120 mg via EPIDURAL

## 2016-03-05 MED ORDER — IOHEXOL 180 MG/ML  SOLN
1.0000 mL | Freq: Once | INTRAMUSCULAR | Status: AC | PRN
  Administered 2016-03-05: 1 mL via EPIDURAL

## 2016-04-10 ENCOUNTER — Encounter: Payer: Self-pay | Admitting: Family Medicine

## 2016-04-10 ENCOUNTER — Ambulatory Visit (INDEPENDENT_AMBULATORY_CARE_PROVIDER_SITE_OTHER): Payer: BLUE CROSS/BLUE SHIELD | Admitting: Family Medicine

## 2016-04-10 VITALS — BP 124/74 | HR 60 | Temp 98.2°F | Wt 162.4 lb

## 2016-04-10 DIAGNOSIS — S40862A Insect bite (nonvenomous) of left upper arm, initial encounter: Secondary | ICD-10-CM

## 2016-04-10 DIAGNOSIS — W57XXXA Bitten or stung by nonvenomous insect and other nonvenomous arthropods, initial encounter: Secondary | ICD-10-CM

## 2016-04-10 NOTE — Progress Notes (Signed)
   Subjective:    Patient ID: Travis Baldwin, male    DOB: 1951/10/06, 65 y.o.   MRN: [id number]  HPI Chief Complaint  Patient presents with  . tick bite    bite friday and redness started monday,    He is here for a tick bite to his left upper arm. He states he noticed there was a dark area that he thought was a scab to his left arm 5 days ago and then he realized it was a tick and pulled it off 2 days ago. States tick was on his arm for approximately 3 days. The tick was tiny per patient and when he pulled it off the tick was not engorged. He states he has a red raised area around the bite and was concerned. He states he has had tick bites in the past and not having issues with them. Denies fever, chills, headache, rash, nausea, vomiting, joint or muscle pain.   Reviewed allergies, medications, past medical history.    Review of Systems Pertinent positives and negatives in the history of present illness.     Objective:   Physical Exam  Musculoskeletal:       Left upper arm: He exhibits no tenderness.       Arms: 4 cm diameter of erythematous and mild induration without pinpint puncture in the center. No drainage or fluctuance. No central clearing.  Pen line drawn around the erythematous area.  Skin: Skin is warm and dry. No rash noted.   BP 124/74 mmHg  Pulse 60  Temp(Src) 98.2 F (36.8 C) (Oral)  Wt 162 lb 6.4 oz (73.664 kg)      Assessment & Plan:  Tick bite of upper arm, left, initial encounter  Discussed that he appears to be having a local reaction to the bite and we recommend that he keep an eye on this and if he develops any other symptoms such as fever, headache, rash then he will let us know. Pen line drawn around the reddened area.Marland Kitchen He will give [place] a call if he notices the redness outside of the pen line. Dr. Redmond School also examined this patient and is in agreement with plan of care.  Follow up as needed.

## 2016-04-10 NOTE — Patient Instructions (Signed)
Call us if the red area grows outside the pen line or if you develop any symptoms such as fever, headache or rash.

## 2016-05-13 ENCOUNTER — Ambulatory Visit (INDEPENDENT_AMBULATORY_CARE_PROVIDER_SITE_OTHER): Payer: BLUE CROSS/BLUE SHIELD | Admitting: Family Medicine

## 2016-05-13 ENCOUNTER — Encounter: Payer: Self-pay | Admitting: Family Medicine

## 2016-05-13 VITALS — BP 116/70 | HR 80 | Temp 98.1°F | Ht 69.0 in | Wt 164.8 lb

## 2016-05-13 DIAGNOSIS — R05 Cough: Secondary | ICD-10-CM

## 2016-05-13 DIAGNOSIS — J0100 Acute maxillary sinusitis, unspecified: Secondary | ICD-10-CM | POA: Diagnosis not present

## 2016-05-13 DIAGNOSIS — Z720 Tobacco use: Secondary | ICD-10-CM

## 2016-05-13 DIAGNOSIS — R059 Cough, unspecified: Secondary | ICD-10-CM

## 2016-05-13 MED ORDER — BENZONATATE 200 MG PO CAPS: 200.0000 mg | ORAL_CAPSULE | Freq: Three times a day (TID) | ORAL | Status: DC | PRN

## 2016-05-13 MED ORDER — AMOXICILLIN-POT CLAVULANATE 875-125 MG PO TABS: 1.0000 | ORAL_TABLET | Freq: Two times a day (BID) | ORAL | Status: DC

## 2016-05-13 NOTE — Progress Notes (Signed)
Chief Complaint  Patient presents with  . Cough    3-4 weeks coughing up yellow mucus. Came in to see Vickie for tick nite 04/10/16-by Sat he went to UC in [place] and was given doxy BID and something for congestion, as he was seen for the congestion and cough as well as the tick bite. Just can't seem to get better-feels somewhat better and cough and congestion comes back. Joints feel achy from time to time.    Tick bite was to L upper arm.  It swelled and was red, (had been attached x 4 days), no other rash.  After being seen here, he got significantly worse as far as congestion/cough was concerned; the redness of the tick bite had actually improved some.  He went to [facility] and was treated with Doxy. No further problems from the tick bite.  Did not have fever,  Cough/congestion continues to come and go, and not completely resolve.  He continues to have yellow and clear phlegm, coming and going. He had right sided cheek pain and yellow drainage from the nose that relieved the cheek discomfort. This was a couple of days ago.  No recurrent sinus pain, but continued cough--sometimes dry, sometimes productive. He tried some leftover astepro which helped some with PND and cough.  He has been taking Mucinex DM once daily, and the astepro as mentioned above. Smoking a little less since he has been sick  He has started to have some recurrent left hip/back pain/tingling since he is coughing a lot.  PMH, PSH, SH reviewed  Outpatient Encounter Prescriptions as of 05/13/2016  Medication Sig Note  . atorvastatin (LIPITOR) 20 MG tablet Take 1 tablet (20 mg total) by mouth daily.   Marland Kitchen co-enzyme Q-10 30 MG capsule Take 30 mg by mouth daily.    . Multiple Vitamins-Minerals (MULTIVITAMIN WITH MINERALS) tablet Take 1 tablet by mouth daily.   . Saw Palmetto, Serenoa repens, (SAW PALMETTO PO) Take 2 tablets by mouth daily. Reported on 04/10/2016   . Aspirin-Salicylamide-Caffeine (BC HEADACHE POWDER PO) Take 1 packet by  mouth as needed. Reported on 05/13/2016 06/01/2014: Not taking; uses occasionally prn headache  . cyclobenzaprine (FLEXERIL) 10 MG tablet Take 1 tablet (10 mg total) by mouth 3 (three) times daily as needed for muscle spasms. (Patient not taking: Reported on 05/13/2016)   . [DISCONTINUED] aspirin 325 MG tablet Take 650 mg by mouth 2 (two) times daily.   . [DISCONTINUED] ibuprofen (ADVIL,MOTRIN) 800 MG tablet Take 800 mg by mouth every 8 (eight) hours as needed. Reported on 05/13/2016 11/16/2015: Uses prn, not in the last month   No facility-administered encounter medications on file as of 05/13/2016.   Allergies  Allergen Reactions  . Codeine Hives  . Vicodin [Hydrocodone-Acetaminophen] Other (See Comments)    Shakiness.   ROS: no fever, chills, headache, dizziness, chest pain, palpitations, nausea, vomiting, diarrhea, bleeding, bruising, rash, shortness of breath, or other concerns except as noted in HPI  PHYSICAL EXAM: BP 116/70 mmHg  Pulse 80  Temp(Src) 98.1 F (36.7 C) (Tympanic)  Ht 5\' 9"  (1.753 m)  Wt 164 lb 12.8 oz (74.753 kg)  BMI 24.33 kg/m2  Well developed, pleasant male, with intermittent dry cough, in no distress HEENT: PERRL, EOMI, conjunctiva and sclera are clear. Nasal mucosa is moderately edematous, +erythema and yellow mucus in R>L. OP is clear. Sinuses nontender Neck: no lymphadenopathy, thyromegaly or mass Heart: regular rate and rhythm without murmur Lungs: clear bilaterally, no wheezes, rales, ronchi, good air movement  Skin: normal turgor, no rash Extremities: no edema Neuro: alert and oriented, cranial nerves intact, normal strength, gait  ASSESSMENT/PLAN:  Acute maxillary sinusitis, recurrence not specified - Plan: amoxicillin-clavulanate (AUGMENTIN) 875-125 MG tablet  Cough - Plan: benzonatate (TESSALON) 200 MG capsule  Tobacco abuse     Drink plenty of water. Continue mucinex--if 12 hour version, please take it twice daily. Consider sinus rinses or  neti-pot. If you develop any further sinus pain, use a decongestant such as sudafed.  Take the antibiotics as prescribed, and PLEASE STOP SMOKING!

## 2016-05-13 NOTE — Patient Instructions (Signed)
  Drink plenty of water. Continue mucinex--if 12 hour version, please take it twice daily. Consider sinus rinses or neti-pot. If you develop any further sinus pain, use a decongestant such as sudafed.  Take the antibiotics as prescribed, and PLEASE STOP SMOKING!

## 2016-06-21 ENCOUNTER — Encounter: Payer: Self-pay | Admitting: Family Medicine

## 2016-09-11 NOTE — Progress Notes (Signed)
Chief Complaint  Patient presents with  . Annual Exam    fasting annual exam. Once in a while has some blood in stools. UA showed 1+ leuks and blood, no symptoms. Left leg still has some weakness from his back. And left arm had a tick bite back in May-about once a month the area where the bite was flares up and gets red like a pimple and he gets sick symptoms for about 3-4 days and then they go away.     Travis Baldwin is a 65 y.o. male who presents for annual physical and follow-up on chronic medical conditions.  He has the following concerns:  Hyperlipidemia follow-up: Patient is reportedly following a low-fat, low cholesterol diet. Compliant with taking lipitor, along with CoQ10.  He stopped his lipitor for about a week in July when having some leg pain (heaviness) and stiffness.  Symptoms resolved off med, but he restarted it, and symptoms haven't recurred. (see MyChart message from patient).  Smoking--he continues to smoke, as does his wife. He is down to average of 1/2 PPD since he can no longer smoke at work. He reports that he and his wife were able to quit smoking for 3 weeks after her surgery within the year. They are both back to smoking.  Left hip/leg pain--s/p 3 epidural injections this past Spring.  No longer has any pain, but it feels "funny", perhaps slightly weaker than the right leg. He has no limitations in his daily routine. MRI in 01/2016 showed: IMPRESSION: Dominant finding is at L5-S1 where there is a prominent left paracentral and lateral recess disc protrusion deflecting and compressing the descending left S1 root. Disc just contacts the exiting left L3 root at L3-4 without compression or displacement. The foramen appears only mildly narrowed. Shallow disc bulge L4-5 without central canal or foraminal narrowing.   Immunization History  Administered Date(s) Administered  . Influenza Split 09/09/2011, 10/09/2012  . Influenza Whole 09/09/1989, 12/05/1999  .  Influenza,inj,Quad PF,36+ Mos 09/29/2013, 12/21/2014, 11/16/2015  . PPD Test 09/10/1995  . Pneumococcal Polysaccharide-23 09/10/1995, 09/09/2011  . Td 11/23/1997  . Tdap 01/10/2009  . Zoster 12/21/2014   Last colonoscopy: 07/2013 Last PSA: 12/2014 Exercise: walking on the job, walks the dog some, not regularly. 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 Ophtho: yearly. Wears one contact for reading and one for distance. Went last month Dentist: wears dentures, went to dentist last year for oral exam.   Other doctors caring for patient include: GI: Dr. Carlean Purl Ophtho: Dr. Driscilla Grammes Dentist--can't recall the name. Went after his last physical for oral exam Neurosurgeon--Dr. Kathyrn Sheriff (seen once this year)  Depression and Fall screens are negative  End of Life Discussion:  Patient does not have a living will and medical power of attorney  Past Medical History:  Diagnosis Date  . Colon polyp    hyperplastic, adenomatous (2008); Dr. Carlean Purl  . Diverticulosis   . Hyperlipidemia   . Impaired fasting glucose 02/2006  . Internal hemorrhoids Grade 2 prolapsing with bleeding 09/03/2013  . Microscopic hematuria    followed by [facility]; normal cystoscopy 05/2009  . Smoker     Past Surgical History:  Procedure Laterality Date  . CHEST TUBE INSERTION  1996   pockets of infection on outside of lung  . COLONOSCOPY  multiple; 07/2013   Dr. Carlean Purl  . HEMORRHOID BANDING  09/2013   Dr. Carlean Purl    Social History   Social History  . Marital status: Married    Spouse name: Clarene Critchley  .  Number of children: 0  . Years of education: N/A   Occupational History  . 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 Smoking status: Current Every Day Smoker    Packs/day: 0.50    Years: 39.00    Types: Cigarettes  . Smokeless tobacco: Never Used     Comment: 1/2 PPD (was able to quit x 3 wks with wife)  . Alcohol use 6.0 oz/week    10 Shots of liquor  per week     Comment: 1 drink each night (rum and coke), 5x/week, 1-2 shots per drink.  . Drug use: No  . Sexual activity: Yes    Partners: Female   Other Topics Concern  . Not on file   Social History Narrative   Married, lives with wife, 1 dog    Family History  Problem Relation Age of Onset  . Hypertension Mother   . Hyperlipidemia Mother   . Cancer Father     bone (jaw)  . Cancer Brother     sarcoma  . Colon cancer Brother     sarcoma around colon  . Diabetes Maternal Aunt   . Diabetes Maternal Grandfather     Outpatient Encounter Prescriptions as of 09/12/2016  Medication Sig Note  . atorvastatin (LIPITOR) 20 MG tablet Take 1 tablet (20 mg total) by mouth daily.   Marland Kitchen co-enzyme Q-10 30 MG capsule Take 30 mg by mouth daily.    . Multiple Vitamins-Minerals (MULTIVITAMIN WITH MINERALS) tablet Take 1 tablet by mouth daily.   . Saw Palmetto, Serenoa repens, (SAW PALMETTO PO) Take 2 tablets by mouth daily. Reported on 04/10/2016   . Aspirin-Salicylamide-Caffeine (BC HEADACHE POWDER PO) Take 1 packet by mouth as needed. Reported on 05/13/2016 06/01/2014: Not taking; uses occasionally prn headache  . [DISCONTINUED] amoxicillin-clavulanate (AUGMENTIN) 875-125 MG tablet Take 1 tablet by mouth 2 (two) times daily.   . [DISCONTINUED] benzonatate (TESSALON) 200 MG capsule Take 1 capsule (200 mg total) by mouth 3 (three) times daily as needed.   . [DISCONTINUED] cyclobenzaprine (FLEXERIL) 10 MG tablet Take 1 tablet (10 mg total) by mouth 3 (three) times daily as needed for muscle spasms. (Patient not taking: Reported on 05/13/2016)    No facility-administered encounter medications on file as of 09/12/2016.     Allergies  Allergen Reactions  . Codeine Hives  . Vicodin [Hydrocodone-Acetaminophen] Other (See Comments)    Shakiness.    ROS: The patient denies anorexia, fever, weight changes, headaches, vision loss, decreased hearing, ear pain, hoarseness, chest pain, palpitations, dizziness,  syncope, dyspnea on exertion, cough, swelling, nausea, vomiting, diarrhea, constipation, abdominal pain, melena, indigestion/heartburn, gross hematuria, incontinence, erectile dysfunction, nocturia, genital lesions, weakness, tremor, suspicious skin lesions, depression, anxiety, abnormal bleeding/bruising, or enlarged lymph nodes. Has had some intermittent hemorrhoidal bleeding since the banding, but much improved, infrequent. Urinary symptoms have improved with the use of saw palmetto. Feels like bladder empties completely. Up just once per night to void. Hands fall asleep sometimes when sleeping (clenches fists, better when he changes positions).  Left leg feels "different", slightly weaker.   PHYSICAL EXAM:  BP 130/80 (BP Location: Left Arm, Patient Position: Sitting, Cuff Size: Normal)   Pulse 68   Ht 5' 8.75" (1.746 m)   Wt 159 lb 12.8 oz (72.5 kg)   BMI 23.77 kg/m    General Appearance:  Alert, cooperative, no distress, appears stated age   Head:  Normocephalic, without obvious abnormality, atraumatic  Eyes:  PERRL, conjunctiva/corneas clear, EOM's intact, fundi benign   Ears:  Normal TM's and external ear canals.  Nose:  Nares normal, mucosa normal, no drainage or sinus tenderness   Throat:  Lips, mucosa, and tongue normal; wearing upper and lower dentures. No mucosal lesions visible  Neck:  Supple, no lymphadenopathy; thyroid: no enlargement/tenderness/nodules; no carotid  bruit or JVD   Back:  Spine nontender, no curvature, ROM normal, no CVA tenderness   Lungs:  Clear to auscultation bilaterally without wheezes, rales or ronchi; respirations unlabored   Chest Wall:  No tenderness or deformity   Heart:  Regular rate and rhythm, S1 and S2 normal, no murmur, rub or gallop   Breast Exam:  No chest wall tenderness, masses or gynecomastia   Abdomen:  Soft, non-tender, nondistended, normoactive bowel sounds, no masses, no hepatosplenomegaly   Genitalia:   Normal male external genitalia without lesions. Testicles without masses. No inguinal hernias.   Rectal:  Normal sphincter tone, no masses or tenderness; heme negative stool. Prostate smooth, no nodules, normal size   Extremities:  No clubbing, cyanosis or edema   Pulses:  2+ and symmetric all extremities   Skin:  Skin color, texture, turgor normal, no rashes or lesions. Mole R cheek oddly shaped (like L-quote), unchanged, uniform color; flesh colored <45m nodule left cheek, unchanged per pt x years. 181mslightly hyperpigmented area on his left upper medial arm--where he states the tick bite was.  No induration, nontender; macular.   Lymph nodes:  Cervical, supraclavicular, and axillary nodes normal   Neurologic:  CNII-XII intact, normal strength, sensation and gait; reflexes 2+ and symmetric throughout   Psych: Normal mood, affect, hygiene and grooming    ASSESSMENT/PLAN:  Annual physical exam - Plan: POCT Urinalysis Dipstick, Pneumococcal conjugate vaccine 13-valent, Comprehensive metabolic panel, Lipid panel, PSA, CBC with Differential/Platelet, TSH  Screening for prostate cancer - Plan: PSA  Tobacco use disorder - counseled extensively re: risks, benefits of quitting; encouraged to set a quit date, along with his wife  Pure hypercholesterolemia - Plan: Comprehensive metabolic panel, Lipid panel  Need for prophylactic vaccination and inoculation against influenza - Plan: Flu vaccine HIGH DOSE PF (Fluzone High dose)  Immunization due - Plan: Pneumococcal conjugate vaccine 13-valent  Lumbosacral radiculopathy at S1 - pain resolved s/p epidurals. If any recurrences/ongoing problems, to f/u with  neurosurgeon   PSA screening, risks/benefits reviewed, recommended at least 30 minutes of aerobic activity at least 5 days/week, weight-bearing exercise at least 2x/week (gets at work); proper sunscreen use reviewed; healthy diet and alcohol recommendations (less than or  equal to 2 drinks/day) reviewed; regular seatbelt use; changing batteries in smoke detectors. Self-testicular exams. Immunization recommendations discussed--high dose flu shot and.Prevnar-13 given.  Risks and side effects reviewed. Colonoscopy recommendations reviewed--UTD.  Counseled regarding smoking cessation  Living will and healthcare power of attorney paperwork were given and discussed.  Flu shot (high dose), prevnar-13 c-met, lipid, PSA, CBC, TSH  RF LIPITOR 90D after labs back

## 2016-09-12 ENCOUNTER — Encounter: Payer: Self-pay | Admitting: Family Medicine

## 2016-09-12 ENCOUNTER — Ambulatory Visit (INDEPENDENT_AMBULATORY_CARE_PROVIDER_SITE_OTHER): Payer: BLUE CROSS/BLUE SHIELD | Admitting: Family Medicine

## 2016-09-12 VITALS — BP 130/80 | HR 68 | Ht 68.75 in | Wt 159.8 lb

## 2016-09-12 DIAGNOSIS — Z23 Encounter for immunization: Secondary | ICD-10-CM

## 2016-09-12 DIAGNOSIS — Z0000 Encounter for general adult medical examination without abnormal findings: Secondary | ICD-10-CM

## 2016-09-12 DIAGNOSIS — F17200 Nicotine dependence, unspecified, uncomplicated: Secondary | ICD-10-CM

## 2016-09-12 DIAGNOSIS — Z125 Encounter for screening for malignant neoplasm of prostate: Secondary | ICD-10-CM | POA: Diagnosis not present

## 2016-09-12 DIAGNOSIS — E7800 Pure hypercholesterolemia, unspecified: Secondary | ICD-10-CM

## 2016-09-12 DIAGNOSIS — M5417 Radiculopathy, lumbosacral region: Secondary | ICD-10-CM

## 2016-09-12 LAB — CBC WITH DIFFERENTIAL/PLATELET
BASOS PCT: 0 %
Basophils Absolute: 0 cells/uL (ref 0–200)
EOS PCT: 2 %
Eosinophils Absolute: 136 cells/uL (ref 15–500)
HCT: 45.9 % (ref 38.5–50.0)
Hemoglobin: 15.3 g/dL (ref 13.2–17.1)
Lymphocytes Relative: 24 %
Lymphs Abs: 1632 cells/uL (ref 850–3900)
MCH: 31.9 pg (ref 27.0–33.0)
MCHC: 33.3 g/dL (ref 32.0–36.0)
MCV: 95.8 fL (ref 80.0–100.0)
MONOS PCT: 7 %
MPV: 10.6 fL (ref 7.5–12.5)
Monocytes Absolute: 476 cells/uL (ref 200–950)
NEUTROS ABS: 4556 {cells}/uL (ref 1500–7800)
Neutrophils Relative %: 67 %
PLATELETS: 166 10*3/uL (ref 140–400)
RBC: 4.79 MIL/uL (ref 4.20–5.80)
RDW: 13.8 % (ref 11.0–15.0)
WBC: 6.8 10*3/uL (ref 4.0–10.5)

## 2016-09-12 LAB — COMPREHENSIVE METABOLIC PANEL
ALBUMIN: 4.5 g/dL (ref 3.6–5.1)
ALT: 18 U/L (ref 9–46)
AST: 18 U/L (ref 10–35)
Alkaline Phosphatase: 59 U/L (ref 40–115)
BILIRUBIN TOTAL: 0.7 mg/dL (ref 0.2–1.2)
BUN: 18 mg/dL (ref 7–25)
CO2: 26 mmol/L (ref 20–31)
CREATININE: 1.1 mg/dL (ref 0.70–1.25)
Calcium: 9.6 mg/dL (ref 8.6–10.3)
Chloride: 104 mmol/L (ref 98–110)
GLUCOSE: 92 mg/dL (ref 65–99)
Potassium: 4.4 mmol/L (ref 3.5–5.3)
SODIUM: 139 mmol/L (ref 135–146)
Total Protein: 6.9 g/dL (ref 6.1–8.1)

## 2016-09-12 LAB — LIPID PANEL
Cholesterol: 158 mg/dL (ref 125–200)
HDL: 51 mg/dL (ref >=40)
LDL CALC: 93 mg/dL (ref <130)
Total CHOL/HDL Ratio: 3.1 Ratio (ref <=5.0)
Triglycerides: 68 mg/dL (ref <150)
VLDL: 14 mg/dL (ref <30)

## 2016-09-12 LAB — POCT URINALYSIS DIPSTICK
Bilirubin, UA: NEGATIVE
GLUCOSE UA: NEGATIVE
Ketones, UA: NEGATIVE
NITRITE UA: NEGATIVE
PH UA: 5
Protein, UA: NEGATIVE
Spec Grav, UA: >=1.03
UROBILINOGEN UA: NEGATIVE

## 2016-09-12 LAB — PSA: PSA: 3.4 ng/mL (ref <=4.0)

## 2016-09-12 LAB — TSH: TSH: 0.52 m[IU]/L (ref 0.40–4.50)

## 2016-09-12 NOTE — Patient Instructions (Addendum)
  HEALTH MAINTENANCE RECOMMENDATIONS:  It is recommended that you get at least 30 minutes of aerobic exercise at least 5 days/week (for weight loss, you may need as much as 60-90 minutes). This can be any activity that gets your heart rate up. This can be divided in 10-15 minute intervals if needed, but try and build up your endurance at least once a week.  Weight bearing exercise is also recommended twice weekly.  Eat a healthy diet with lots of vegetables, fruits and fiber.  "Colorful" foods have a lot of vitamins (ie green vegetables, tomatoes, red peppers, etc).  Limit sweet tea, regular sodas and alcoholic beverages, all of which has a lot of calories and sugar.  Up to 2 alcoholic drinks daily may be beneficial for men (unless trying to lose weight, watch sugars).  Drink a lot of water.  Sunscreen of at least SPF 30 should be used on all sun-exposed parts of the skin when outside between the hours of 10 am and 4 pm (not just when at beach or pool, but even with exercise, golf, tennis, and yard work!)  Use a sunscreen that says "broad spectrum" so it covers both UVA and UVB rays, and make sure to reapply every 1-2 hours.  Remember to change the batteries in your smoke detectors when changing your clock times in the spring and fall.  Use your seat belt every time you are in a car, and please drive safely and not be distracted with cell phones and texting while driving.  SET A QUIT DATE AND PLEASE TRY AND QUIT SMOKING!  Start thinking about why/when you smoke (habit, boredom, stress) in order to come up with effective strategies to cut back or quit. Available resources to help you quit include free counseling through [facility] Quitline ([place]Quitline.[facility] or 1-800-QUITNOW), smoking cessation classes through [facility] (call to find out schedule), over-the-counter nicotine replacements, and e-cigarettes (although this may not help break the hand-mouth habit).  Many insurance companies  also have smoking cessation programs (which may decrease the cost of patches, meds if enrolled).  If these methods are not effective for you, and you are motivated to quit, return to discuss the possibility of prescription medications.  Please look at the Living Will and healthcare power of attorney paperwork.  Once it is filled out and notarized, please get [place] a copy at your convenience.

## 2016-09-13 MED ORDER — ATORVASTATIN CALCIUM 20 MG PO TABS: 20.0000 mg | ORAL_TABLET | Freq: Every day | ORAL | 3 refills | Status: DC

## 2017-09-23 NOTE — Progress Notes (Signed)
Chief Complaint  Patient presents with  . annual wellness    annual wellness visit. no concerns. will get a flu shot todya    Travis Baldwin is a 66 y.o. male who presents for a complete physical.  He has the following concerns:  Hyperlipidemia follow-up: Patient is reportedly following a low-fat, low cholesterol diet. Compliant with taking lipitor, along with CoQ10.  Smoking--he continues to smoke, as does his wife. He cut back to 4/day most days, but actually hasn't smoked a cigarette in a week.  Smoking since age 77, close to a pack/day for about 38 years. He hasn't set a quit date.    Left hip/leg pain--s/p 3 epidural injections in Spring 2017.  No longer has any pain, doing better than even last year.  Occasional muscle cramps in the left calf, only at night. Sometimes he will take 2 potassium pills and it goes away. Last occurred 1-2 weeks ago.  Immunization History  Administered Date(s) Administered  . Influenza Split 09/09/2011, 10/09/2012  . Influenza Whole 09/09/1989, 12/05/1999  . Influenza, High Dose Seasonal PF 09/12/2016  . Influenza,inj,Quad PF,6+ Mos 09/29/2013, 12/21/2014, 11/16/2015  . PPD Test 09/10/1995  . Pneumococcal Conjugate-13 09/12/2016  . Pneumococcal Polysaccharide-23 09/10/1995, 09/09/2011  . Td 11/23/1997  . Tdap 01/10/2009  . Zoster 12/21/2014   Last colonoscopy: 07/2013 Last PSA:  Lab Results  Component Value Date   PSA 3.4 09/12/2016   PSA 2.51 12/21/2014   PSA 2.26 09/29/2013  Exercise: walking on the job, walks the dog some, not regularly, yardwork. 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 Ophtho: yearly. Wears one contact for reading and one for distance.  Dentist: wears dentures, went to dentist 2 years ago for oral exam.    Other doctors caring for patient include: GI: Dr. Carlean Purl Ophtho: [facility] [address] (different doctor each year) Dentist--can't recall the name. Went 2 years ago for oral exam Neurosurgeon--Dr.  Kathyrn Sheriff  Urologist--[facility] (hasn't been in many years, was for eval of microscopic hematuria)  Depression and Fall screens are negative Functional Status Survey was normal  End of Life Discussion:  Patient does not have a living will and medical power of attorney  Past Medical History:  Diagnosis Date  . Colon polyp    hyperplastic, adenomatous (2008); Dr. Carlean Purl  . Diverticulosis   . Hyperlipidemia   . Impaired fasting glucose 02/2006  . Internal hemorrhoids Grade 2 prolapsing with bleeding 09/03/2013  . Microscopic hematuria    followed by [facility]; normal cystoscopy 05/2009  . Smoker     Past Surgical History:  Procedure Laterality Date  . CHEST TUBE INSERTION  1996   pockets of infection on outside of lung  . COLONOSCOPY  multiple; 07/2013   Dr. Carlean Purl  . HEMORRHOID BANDING  09/2013   Dr. Carlean Purl    Social History   Social History  . Marital status: Married    Spouse name: Clarene Critchley  . Number of children: 0  . Years of education: N/A   Occupational History  . 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 Smoking status: Current Every Day Smoker    Packs/day: 0.50    Years: 39.00    Types: Cigarettes  . Smokeless tobacco: Never Used     Comment: down to 4/d; smoked 1 PPD for 38 years before cutting back  . Alcohol use 6.0 oz/week    10 Shots of liquor per week     Comment: 1  drink each night (rum and coke), 4x/week, 1 shot per drink.  . Drug use: No  . Sexual activity: Yes    Partners: Female   Other Topics Concern  . Not on file   Social History Narrative   Married, lives with wife, 1 dog    Family History  Problem Relation Age of Onset  . Hypertension Mother   . Hyperlipidemia Mother   . Cancer Father        bone (jaw)  . Cancer Brother        sarcoma  . Colon cancer Brother        sarcoma around colon  . Diabetes Maternal Aunt   . Diabetes Maternal Grandfather     Outpatient  Encounter Prescriptions as of 09/24/2017  Medication Sig Note  . Aspirin-Salicylamide-Caffeine (BC HEADACHE POWDER PO) Take 1 packet by mouth as needed. Reported on 05/13/2016 06/01/2014: Not taking; uses occasionally prn headache  . atorvastatin (LIPITOR) 20 MG tablet Take 1 tablet (20 mg total) by mouth daily.   Marland Kitchen co-enzyme Q-10 30 MG capsule Take 30 mg by mouth daily.    . Multiple Vitamins-Minerals (MULTIVITAMIN WITH MINERALS) tablet Take 1 tablet by mouth daily.   . Travis Palmetto, Serenoa repens, (Travis PALMETTO PO) Take 2 tablets by mouth daily. Reported on 04/10/2016    No facility-administered encounter medications on file as of 09/24/2017.     Allergies  Allergen Reactions  . Codeine Hives  . Vicodin [Hydrocodone-Acetaminophen] Other (See Comments)    Shakiness.    ROS: The patient denies anorexia, fever, weight changes, headaches, vision loss, decreased hearing, ear pain, hoarseness, chest pain, palpitations, dizziness, syncope, dyspnea on exertion, cough, swelling, nausea, vomiting, diarrhea, constipation, abdominal pain, melena, indigestion/heartburn, gross hematuria, incontinence, erectile dysfunction, nocturia, genital lesions, weakness, tremor, suspicious skin lesions, depression, anxiety, abnormal bleeding/bruising, or enlarged lymph nodes. Has had some intermittent hemorrhoidal bleeding since the banding, but much improved, infrequent. Urinary symptoms have improved with the use of Travis palmetto and other supplement. Feels like bladder empties completely. Up 0-1 x at night to void. Hands fall asleep sometimes when sleeping (clenches fists, better when he changes positions).   +weight loss--states he thinks it is from the "hot summer".  Drinks fewer sodas, smaller portion at dinner. Occasionally he notices blood in the stool (sees on toilet paper), denies constipation.  Sees this more often after a looser stool. Occurs about 1x/month. Occasional calf cramp at night, on the left  only.   PHYSICAL EXAM:  BP 122/80   Pulse (!) 57   Ht 5' 9.5" (1.765 m)   Wt 152 lb (68.9 kg)   BMI 22.12 kg/m   Wt Readings from Last 3 Encounters:  09/24/17 152 lb (68.9 kg)  09/12/16 159 lb 12.8 oz (72.5 kg)  05/13/16 164 lb 12.8 oz (74.8 kg)    General Appearance:  Alert, cooperative, no distress, appears stated age   Head:  Normocephalic, without obvious abnormality, atraumatic   Eyes:  PERRL, conjunctiva/corneas clear, EOM's intact, fundi benign   Ears:  Normal TM's and external ear canals.  Nose:  Nares normal, mucosa normal, no drainage or sinus tenderness   Throat:  Lips, mucosa, and tongue normal; wearing upper and lower dentures. No mucosal lesions visible  Neck:  Supple, no lymphadenopathy; thyroid: no enlargement/tenderness/nodules; no carotid bruit or JVD   Back:  Spine nontender, no curvature, ROM normal, no CVA tenderness   Lungs:  Clear to auscultation bilaterally without wheezes, rales or ronchi; respirations unlabored  Chest Wall:  No tenderness or deformity   Heart:  Regular rate and rhythm, S1 and S2 normal, no murmur, rub or gallop   Breast Exam:  No chest wall tenderness, masses or gynecomastia   Abdomen:  Soft, non-tender, nondistended, normoactive bowel sounds, no masses, no hepatosplenomegaly   Genitalia:  Normal male external genitalia without lesions. Testicles without masses. No inguinal hernias.   Rectal:  Normal sphincter tone, no masses or tenderness; heme negative stool. Prostate smooth, no nodules, normal size. Mildly inflamed, nonbleeding, external hemorrhoid   Extremities:  No clubbing, cyanosis or edema   Pulses:  2+ and symmetric all extremities   Skin:  Skin color, texture, turgor normal, no rashes or lesions. Mole R cheek oddly shaped (like L-quote), unchanged, uniform color  Lymph nodes:  Cervical, supraclavicular, and axillary nodes normal   Neurologic:  CNII-XII intact, normal strength, sensation and  gait; reflexes 2+ and symmetric throughout   Psych: Normal mood, affect, hygiene and grooming   ASSESSMENT/PLAN:  Annual physical exam - Plan: Lipid panel, Comprehensive metabolic panel, CBC with Differential/Platelet, TSH, PSA, POCT Urinalysis DIP (Proadvantage Device)  Tobacco use disorder - encouraged complete cessation; refer for CT screening (pulm clinic)  Pure hypercholesterolemia - Plan: Lipid panel, atorvastatin (LIPITOR) 20 MG tablet  Immunization due - Plan: Pneumococcal polysaccharide vaccine 23-valent greater than or equal to 2yo subcutaneous/IM  Screening for prostate cancer - had somewhat elevated PSA velocity on last check.  If further increase, may need referral back to [facility]: PSA  Weight loss - likely related to dietary changes - Plan: TSH  Medication monitoring encounter - Plan: Lipid panel, Comprehensive metabolic panel  Rectal bleeding - likely hemorrhoidal, not significant - Plan: CBC with Differential/Platelet   PSA screening, risks/benefits reviewed, recommended at least 30 minutes of aerobic activity at least 5 days/week, weight-bearing exercise at least 2x/week (gets at work); proper sunscreen use reviewed; healthy diet and alcohol recommendations (less than or equal to 2 drinks/day) reviewed; regular seatbelt use; changing batteries in smoke detectors. Self-testicular exams. Immunization recommendations discussed--high dose flu shot and.Pneumovax given. Shingrix recommended. Risks and side effects reviewed. Colonoscopy recommendations reviewed--UTD, due again 07/2020 Counseled regarding smoking cessation  Living will and healthcare power of attorney paperwork were given and discussed. Full Code, Full Care   Referral for lung cancer screening clinic for CT  c-met, lipid, PSA, CBC, TSH   Call rather than MyChart if lipitor is to be changed (rx sent today for 1 year, assuming still at goal).

## 2017-09-24 ENCOUNTER — Encounter: Payer: Self-pay | Admitting: Family Medicine

## 2017-09-24 ENCOUNTER — Other Ambulatory Visit: Payer: Self-pay | Admitting: *Deleted

## 2017-09-24 ENCOUNTER — Ambulatory Visit (INDEPENDENT_AMBULATORY_CARE_PROVIDER_SITE_OTHER): Payer: BLUE CROSS/BLUE SHIELD | Admitting: Family Medicine

## 2017-09-24 VITALS — BP 122/80 | HR 57 | Ht 69.5 in | Wt 152.0 lb

## 2017-09-24 DIAGNOSIS — Z125 Encounter for screening for malignant neoplasm of prostate: Secondary | ICD-10-CM | POA: Diagnosis not present

## 2017-09-24 DIAGNOSIS — Z5181 Encounter for therapeutic drug level monitoring: Secondary | ICD-10-CM

## 2017-09-24 DIAGNOSIS — Z129 Encounter for screening for malignant neoplasm, site unspecified: Secondary | ICD-10-CM

## 2017-09-24 DIAGNOSIS — Z23 Encounter for immunization: Secondary | ICD-10-CM | POA: Diagnosis not present

## 2017-09-24 DIAGNOSIS — F17200 Nicotine dependence, unspecified, uncomplicated: Secondary | ICD-10-CM

## 2017-09-24 DIAGNOSIS — R634 Abnormal weight loss: Secondary | ICD-10-CM

## 2017-09-24 DIAGNOSIS — E7800 Pure hypercholesterolemia, unspecified: Secondary | ICD-10-CM

## 2017-09-24 DIAGNOSIS — K625 Hemorrhage of anus and rectum: Secondary | ICD-10-CM

## 2017-09-24 DIAGNOSIS — Z0000 Encounter for general adult medical examination without abnormal findings: Secondary | ICD-10-CM | POA: Diagnosis not present

## 2017-09-24 LAB — POCT URINALYSIS DIP (PROADVANTAGE DEVICE)
BILIRUBIN UA: NEGATIVE
BILIRUBIN UA: NEGATIVE mg/dL
Blood, UA: NEGATIVE
GLUCOSE UA: NEGATIVE mg/dL
NITRITE UA: NEGATIVE
Protein Ur, POC: NEGATIVE mg/dL
Specific Gravity, Urine: 1.025
Urobilinogen, Ur: NEGATIVE
pH, UA: 6 (ref 5.0–8.0)

## 2017-09-24 MED ORDER — ATORVASTATIN CALCIUM 20 MG PO TABS: 20.0000 mg | ORAL_TABLET | Freq: Every day | ORAL | 3 refills | Status: DC

## 2017-09-24 NOTE — Patient Instructions (Addendum)
  HEALTH MAINTENANCE RECOMMENDATIONS:  It is recommended that you get at least 30 minutes of aerobic exercise at least 5 days/week (for weight loss, you may need as much as 60-90 minutes). This can be any activity that gets your heart rate up. This can be divided in 10-15 minute intervals if needed, but try and build up your endurance at least once a week.  Weight bearing exercise is also recommended twice weekly.  Eat a healthy diet with lots of vegetables, fruits and fiber.  "Colorful" foods have a lot of vitamins (ie green vegetables, tomatoes, red peppers, etc).  Limit sweet tea, regular sodas and alcoholic beverages, all of which has a lot of calories and sugar.  Up to 2 alcoholic drinks daily may be beneficial for men (unless trying to lose weight, watch sugars).  Drink a lot of water.  Sunscreen of at least SPF 30 should be used on all sun-exposed parts of the skin when outside between the hours of 10 am and 4 pm (not just when at beach or pool, but even with exercise, golf, tennis, and yard work!)  Use a sunscreen that says "broad spectrum" so it covers both UVA and UVB rays, and make sure to reapply every 1-2 hours.  Remember to change the batteries in your smoke detectors when changing your clock times in the spring and fall.  Use your seat belt every time you are in a car, and please drive safely and not be distracted with cell phones and texting while driving.  We gave you a flu shot and pneumovax today. Tetanus is due 01/2019  I recommend getting the new shingles vaccine (Shingrix). You will need to check with your insurance to see if it is covered, and if covered by Medicare Part D, you need to get from the pharmacy rather than our office.  It is a series of 2 injections, spaced 2 months apart.  We are going to refer you for lung cancer screening (pulmonary clinic, and then for CT scan) as we discussed.  Please be sure to discuss and fill out the living will and healthcare power of  attorney paperwork that you were given.  Look at your prostate supplement--if it also contains saw palmetto, then stop the other one. You don't want to be taking too much, from two different supplements.

## 2017-09-25 LAB — CBC WITH DIFFERENTIAL/PLATELET
BASOS PCT: 0.3 %
Basophils Absolute: 18 cells/uL (ref 0–200)
EOS ABS: 122 {cells}/uL (ref 15–500)
Eosinophils Relative: 2 %
HEMATOCRIT: 44.1 % (ref 38.5–50.0)
Hemoglobin: 15.1 g/dL (ref 13.2–17.1)
LYMPHS ABS: 1434 {cells}/uL (ref 850–3900)
MCH: 32.3 pg (ref 27.0–33.0)
MCHC: 34.2 g/dL (ref 32.0–36.0)
MCV: 94.2 fL (ref 80.0–100.0)
MPV: 11.4 fL (ref 7.5–12.5)
Monocytes Relative: 7.5 %
NEUTROS PCT: 66.7 %
Neutro Abs: 4069 cells/uL (ref 1500–7800)
Platelets: 173 10*3/uL (ref 140–400)
RBC: 4.68 10*6/uL (ref 4.20–5.80)
RDW: 12.4 % (ref 11.0–15.0)
Total Lymphocyte: 23.5 %
WBC: 6.1 10*3/uL (ref 3.8–10.8)
WBCMIX: 458 {cells}/uL (ref 200–950)

## 2017-09-25 LAB — COMPREHENSIVE METABOLIC PANEL
AG Ratio: 2 (calc) (ref 1.0–2.5)
ALKALINE PHOSPHATASE (APISO): 63 U/L (ref 40–115)
ALT: 28 U/L (ref 9–46)
AST: 23 U/L (ref 10–35)
Albumin: 4.7 g/dL (ref 3.6–5.1)
BILIRUBIN TOTAL: 0.7 mg/dL (ref 0.2–1.2)
BUN: 20 mg/dL (ref 7–25)
CALCIUM: 9.8 mg/dL (ref 8.6–10.3)
CO2: 27 mmol/L (ref 20–32)
Chloride: 103 mmol/L (ref 98–110)
Creat: 1.05 mg/dL (ref 0.70–1.25)
Globulin: 2.3 g/dL (calc) (ref 1.9–3.7)
Glucose, Bld: 91 mg/dL (ref 65–99)
Potassium: 4.7 mmol/L (ref 3.5–5.3)
Sodium: 138 mmol/L (ref 135–146)
TOTAL PROTEIN: 7 g/dL (ref 6.1–8.1)

## 2017-09-25 LAB — LIPID PANEL
CHOL/HDL RATIO: 2.9 (calc) (ref <5.0)
CHOLESTEROL: 155 mg/dL (ref <200)
HDL: 53 mg/dL (ref >40)
LDL CHOLESTEROL (CALC): 87 mg/dL
Non-HDL Cholesterol (Calc): 102 mg/dL (calc) (ref <130)
Triglycerides: 61 mg/dL (ref <150)

## 2017-09-25 LAB — TSH: TSH: 0.55 m[IU]/L (ref 0.40–4.50)

## 2017-09-25 LAB — PSA: PSA: 2.8 ng/mL (ref <=4.0)

## 2017-09-25 LAB — SPECIMEN COMPROMISED

## 2017-10-01 ENCOUNTER — Encounter: Payer: Self-pay | Admitting: Family Medicine

## 2018-09-29 NOTE — Progress Notes (Signed)
Chief Complaint  Patient presents with  . Annual Exam    fasting annual exam, gets eyes checked @ [facility]. No concerns today.     Travis Baldwin is a 67 y.o. male who presents for a complete physical.  He has the following concerns.  He will be losing his job 10/09/18, so looking into Medicare plans.  Hyperlipidemia follow-up: Patient is reportedly following a low-fat, low cholesterol diet. Compliant with taking lipitor, along with CoQ10. Fasting for lipids today.  Smoking--he continues to smoke, as does his wife. He cut back to 1 pack/week.  Smoking since age 57, close to a pack/day for over 68 years. He hasn't set a quit date.   He has been able to quit for a week, restarted when he learned about losing his job.  Left hip/leg pain--s/p 3 epidural injections in Spring 2017. No longer has any pain, doing well.  Occasional muscle cramps in the left calf, only at night. Sometimes he will take 2 potassium pills and it goes away--uses it prn, about every few days. He tries drinking more water, which might help some.  Immunization History  Administered Date(s) Administered  . Influenza Split 09/09/2011, 10/09/2012  . Influenza Whole 09/09/1989, 12/05/1999  . Influenza, High Dose Seasonal PF 09/12/2016  . Influenza,inj,Quad PF,6+ Mos 09/29/2013, 12/21/2014, 11/16/2015  . PPD Test 09/10/1995  . Pneumococcal Conjugate-13 09/12/2016  . Pneumococcal Polysaccharide-23 09/10/1995, 09/09/2011, 09/24/2017  . Td 11/23/1997  . Tdap 01/10/2009  . Zoster 12/21/2014   Last colonoscopy: 07/2013, due again 07/2020 Last PSA:  Lab Results  Component Value Date   PSA 2.8 09/24/2017   PSA 3.4 09/12/2016   PSA 2.51 12/21/2014  Exercise: walking on the job, walks the dog some, not regularly, yardwork. 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  Has a treadmill at home (doesn't use) Ophtho: yearly. Wears one contact for reading and one for distance.  Dentist: wears dentures, went to dentist 3 years ago for  oral exam.    Other doctors caring for patient include: GI: Dr. Carlean Purl Ophtho: [facility] [address] (different doctor each year) Dentist--Dr. Jamie Kato. Went 3 years ago for oral exam Neurosurgeon--Dr. Kathyrn Sheriff  Urologist--[facility] (hasn't been in many years, was for eval of microscopic hematuria)  Depression and Fall screens are negative Functional Status survey is unremarkable Mini-cog screen normal  End of Life Discussion: Patient does not havea living will and medical power of attorney  Past Medical History:  Diagnosis Date  . Colon polyp    hyperplastic, adenomatous (2008); Dr. Carlean Purl  . Diverticulosis   . Hyperlipidemia   . Impaired fasting glucose 02/2006  . Internal hemorrhoids Grade 2 prolapsing with bleeding 09/03/2013  . Microscopic hematuria    followed by [facility]; normal cystoscopy 05/2009  . Smoker     Past Surgical History:  Procedure Laterality Date  . CHEST TUBE INSERTION  1996   pockets of infection on outside of lung  . COLONOSCOPY  multiple; 07/2013   Dr. Carlean Purl  . HEMORRHOID BANDING  09/2013   Dr. Carlean Purl    Social History   Socioeconomic History  . Marital status: Married    Spouse name: Clarene Critchley  . Number of children: 0  . Years of education: Not on file  . Highest education level: Not on file  Occupational History  . Occupation: shipping/receiving for Nurse, adult company    Employer: [employer]  . Financial resource strain: Not on file  . Food insecurity:  Worry: Not on file    Inability: Not on file  . Transportation needs:    Medical: Not on file    Non-medical: Not on file  Tobacco Use  . Smoking status: Current Every Day Smoker    Packs/day: 0.50    Years: 39.00    Pack years: 19.50    Types: Cigarettes  . Smokeless tobacco: Never Used  . Tobacco comment: 1 pack/week; smoked 1 PPD for 38 years before cutting back  Substance and Sexual Activity  . Alcohol use:  Yes    Alcohol/week: 10.0 standard drinks    Types: 10 Shots of liquor per week    Comment: 1 drink each night (rum and coke), 4-5x/week, 1 shot per drink.  . Drug use: No  . Sexual activity: Yes    Partners: Female  Lifestyle  . Physical activity:    Days per week: Not on file    Minutes per session: Not on file  . Stress: Not on file  Relationships  . Social connections:    Talks on phone: Not on file    Gets together: Not on file    Attends religious service: Not on file    Active member of club or organization: Not on file    Attends meetings of clubs or organizations: Not on file    Relationship status: Not on file  . Intimate partner violence:    Fear of current or ex partner: Not on file    Emotionally abused: Not on file    Physically abused: Not on file    Forced sexual activity: Not on file  Other Topics Concern  . Not on file  Social History Narrative   Married, lives with wife, 1 dog    Family History  Problem Relation Age of Onset  . Hypertension Mother   . Hyperlipidemia Mother   . Cancer Father        bone (jaw)  . Cancer Brother        sarcoma  . Colon cancer Brother        sarcoma around colon  . Diabetes Maternal Aunt   . Diabetes Maternal Grandfather     Outpatient Encounter Medications as of 09/30/2018  Medication Sig Note  . atorvastatin (LIPITOR) 20 MG tablet Take 1 tablet (20 mg total) by mouth daily.   Marland Kitchen co-enzyme Q-10 30 MG capsule Take 30 mg by mouth daily.    . Multiple Vitamins-Minerals (MULTIVITAMIN WITH MINERALS) tablet Take 1 tablet by mouth daily.   Marland Kitchen OVER THE COUNTER MEDICATION Take 1 tablet by mouth 2 (two) times daily. 09/24/2017: Super beta prostate  . Aspirin-Salicylamide-Caffeine (BC HEADACHE POWDER PO) Take 1 packet by mouth as needed. Reported on 05/13/2016 06/01/2014: Not taking; uses occasionally prn headache  . [DISCONTINUED] Saw Palmetto, Serenoa repens, (SAW PALMETTO PO) Take 2 tablets by mouth daily. Reported on 04/10/2016     No facility-administered encounter medications on file as of 09/30/2018.     Allergies  Allergen Reactions  . Codeine Hives  . Vicodin [Hydrocodone-Acetaminophen] Other (See Comments)    Shakiness.     ROS: The patient denies anorexia, fever, weight changes, headaches, vision loss,decreased hearing, ear pain, hoarseness, chest pain, palpitations, dizziness, syncope, dyspnea on exertion, cough, swelling, nausea, vomiting, diarrhea, constipation, abdominal pain, melena, indigestion/heartburn, gross hematuria, incontinence, erectile dysfunction, nocturia, genital lesions, weakness, tremor, suspicious skin lesions, depression, anxiety, abnormal bleeding/bruising, or enlarged lymph nodes.  Urinary symptoms have improved with the use of saw palmetto and other  supplement. Feels like bladder empties completely. Up 0-1 x at night to void. Hands fall asleep sometimes when sleeping (clenches fists, better when he changes positions).  Occasionally he notices blood in the stool (sees on toilet paper), denies constipation.  Sees this more often after a looser stool. Occurs about 1x/month. +calf cramp at night, on the left only. Uses potassium supplement after getting, which he thinks helps.  1-2x/week.   PHYSICAL EXAM:  BP 120/78   Pulse 64   Ht 5' 8.75" (1.746 m)   Wt 157 lb 3.2 oz (71.3 kg)   BMI 23.38 kg/m   Wt Readings from Last 3 Encounters:  09/30/18 157 lb 3.2 oz (71.3 kg)  09/24/17 152 lb (68.9 kg)  09/12/16 159 lb 12.8 oz (72.5 kg)    General Appearance:  Alert, cooperative, no distress, appears stated age   Head:  Normocephalic, without obvious abnormality, atraumatic   Eyes:  PERRL, conjunctiva/corneas clear, EOM's intact, fundi benign   Ears:  Normal TM's and external ear canals.  Nose:  Nares normal, mucosa normal, no drainage or sinus tenderness   Throat:  Lips, mucosa, and tongue normal; wearing upper and lower dentures. No mucosal lesions visible  Neck:   Supple, no lymphadenopathy; thyroid: no enlargement/tenderness/ nodules; no carotid bruit or JVD   Back:  Spine nontender, no curvature, ROM normal, no CVA tenderness   Lungs:  Clear to auscultation bilaterally without wheezes, rales or ronchi; respirations unlabored   Chest Wall:  No tenderness or deformity   Heart:  Regular rate and rhythm, S1 and S2 normal, no murmur, rub or gallop   Breast Exam:  No chest wall tenderness, masses or gynecomastia   Abdomen:  Soft, non-tender, nondistended, normoactive bowel sounds, no masses, no hepatosplenomegaly   Genitalia:  Normal male external genitalia without lesions. Testicles without masses. No inguinal hernias.   Rectal:  Normal sphincter tone, no masses or tenderness; heme negative stool.Prostate smooth, no nodules, normal size. External hemorrhoids are not inflamed  Extremities:  No clubbing, cyanosis or edema   Pulses:  2+ and symmetric all extremities   Skin:  Skin color, texture, turgor normal, no rashes or lesions. Mole R cheek oddly shaped (like L-quote), unchanged, uniform color  Lymph nodes:  Cervical, supraclavicular, and axillary nodes normal   Neurologic:  CNII-XII intact, normal strength, sensation and gait; reflexes 2+ and symmetric throughout   Psych: Normal mood, affect, hygiene and grooming  Normal urine dip   ASSESSMENT/PLAN:  Annual physical exam - Plan: POCT Urinalysis DIP (Proadvantage Device), Comprehensive metabolic panel, Lipid panel, PSA, CBC with Differential/Platelet  Pure hypercholesterolemia - Plan: Lipid panel, atorvastatin (LIPITOR) 20 MG tablet  Tobacco use disorder - counseled and encouraged cessation  Screening for prostate cancer - Plan: PSA  Medication monitoring encounter - Plan: Comprehensive metabolic panel, Lipid panel, CBC with Differential/Platelet  Encounter for screening for lung cancer - Plan: Ambulatory Referral for Lung Cancer Scre  Smoker - Plan:  Ambulatory Referral for Lung Cancer Scre  Immunization due - counseled re: risks/SE of Tdap, shingrix, flu.  He declined checking insurance coverage prior to today's Shingrix; knows 2nd needs to come from pharmacy - Plan: Tdap vaccine greater than or equal to 7yo IM, Varicella-zoster vaccine IM (Shingrix)  Need for influenza vaccination - Plan: Flu vaccine HIGH DOSE PF (Fluzone High dose)   Flu shot TdaP booster Shingrix (prefers to get today, hasn't called to check coverage but will pay if not covered.)--advised he needs to get the second vaccine  from the pharmacy.  PSA screening, risks/benefits reviewed, recommended at least 30 minutes of aerobic activity at least 5 days/week, weight-bearing exercise at least 2x/week (gets at work); proper sunscreen use reviewed; healthy diet and alcohol recommendations (less than or equal to 2 drinks/day) reviewed; regular seatbelt use; changing batteries in smoke detectors. Immunization recommendations discussed--high dose flu shot given, as well as TdaP and Shingrix. Colonoscopy recommendations reviewed--UTD, due again 07/2020 Counseled regarding smoking cessation Counseled re: job Scientist, research (life sciences), staying active, keeping busy (hobbies, part-time work, Social research officer, government).  Living will and healthcare power of attorney paperwork were given and discussed again. Full Code, Full Care   c-met, lipid, PSA, CBC  F/u 1 year for Welcome to Medicare physical

## 2018-09-29 NOTE — Patient Instructions (Addendum)
  HEALTH MAINTENANCE RECOMMENDATIONS:  It is recommended that you get at least 30 minutes of aerobic exercise at least 5 days/week (for weight loss, you may need as much as 60-90 minutes). This can be any activity that gets your heart rate up. This can be divided in 10-15 minute intervals if needed, but try and build up your endurance at least once a week.  Weight bearing exercise is also recommended twice weekly.  Eat a healthy diet with lots of vegetables, fruits and fiber.  "Colorful" foods have a lot of vitamins (ie green vegetables, tomatoes, red peppers, etc).  Limit sweet tea, regular sodas and alcoholic beverages, all of which has a lot of calories and sugar.  Up to 2 alcoholic drinks daily may be beneficial for men (unless trying to lose weight, watch sugars).  Drink a lot of water.  Sunscreen of at least SPF 30 should be used on all sun-exposed parts of the skin when outside between the hours of 10 am and 4 pm (not just when at beach or pool, but even with exercise, golf, tennis, and yard work!)  Use a sunscreen that says "broad spectrum" so it covers both UVA and UVB rays, and make sure to reapply every 1-2 hours.  Remember to change the batteries in your smoke detectors when changing your clock times in the spring and fall.  Use your seat belt every time you are in a car, and please drive safely and not be distracted with cell phones and texting while driving.  Please try and quit smoking--start thinking about why/when you smoke (habit, boredom, stress) in order to come up with effective strategies to cut back or quit. Available resources to help you quit include free counseling through [facility] Quitline ([place]Quitline.com or 1-800-QUITNOW), smoking cessation classes through [facility] (call to find out schedule), over-the-counter nicotine replacements, and e-cigarettes (although this may not help break the hand-mouth habit).  Many insurance companies also have smoking  cessation programs (which may decrease the cost of patches, meds if enrolled).  If these methods are not effective for you, and you are motivated to quit, return to discuss the possibility of prescription medications.  You were given your first dose of Shingrix (shingles vaccine) today. You will need the second vaccine in 2 months.  Once you are on Medicare, you need to get this from the pharmacy (covered by pharmacy benefit), NOT at our office.  Be sure to get it prior to 6 months from today (ideally in 2 months, but there is some leeway if the pharmacy doesn't have it available).  We discussed using your treadmill, and getting regular exercise and adding in weight-bearing exercise since you won't have the weights and activity from the job.

## 2018-09-30 ENCOUNTER — Encounter: Payer: Self-pay | Admitting: Family Medicine

## 2018-09-30 ENCOUNTER — Ambulatory Visit: Payer: BLUE CROSS/BLUE SHIELD | Admitting: Family Medicine

## 2018-09-30 VITALS — BP 120/78 | HR 64 | Ht 68.75 in | Wt 157.2 lb

## 2018-09-30 DIAGNOSIS — E7800 Pure hypercholesterolemia, unspecified: Secondary | ICD-10-CM | POA: Diagnosis not present

## 2018-09-30 DIAGNOSIS — F17200 Nicotine dependence, unspecified, uncomplicated: Secondary | ICD-10-CM

## 2018-09-30 DIAGNOSIS — Z5181 Encounter for therapeutic drug level monitoring: Secondary | ICD-10-CM

## 2018-09-30 DIAGNOSIS — Z0000 Encounter for general adult medical examination without abnormal findings: Secondary | ICD-10-CM | POA: Diagnosis not present

## 2018-09-30 DIAGNOSIS — Z23 Encounter for immunization: Secondary | ICD-10-CM

## 2018-09-30 DIAGNOSIS — Z122 Encounter for screening for malignant neoplasm of respiratory organs: Secondary | ICD-10-CM

## 2018-09-30 DIAGNOSIS — Z125 Encounter for screening for malignant neoplasm of prostate: Secondary | ICD-10-CM

## 2018-09-30 LAB — POCT URINALYSIS DIP (PROADVANTAGE DEVICE)
Bilirubin, UA: NEGATIVE
Blood, UA: NEGATIVE
Glucose, UA: NEGATIVE mg/dL
Ketones, POC UA: NEGATIVE mg/dL
Leukocytes, UA: NEGATIVE
Nitrite, UA: NEGATIVE
Protein Ur, POC: NEGATIVE mg/dL
SPECIFIC GRAVITY, URINE: 1.01
UUROB: NEGATIVE
pH, UA: 6 (ref 5.0–8.0)

## 2018-09-30 MED ORDER — ATORVASTATIN CALCIUM 20 MG PO TABS: 20.0000 mg | ORAL_TABLET | Freq: Every day | ORAL | 3 refills | Status: DC

## 2018-10-01 LAB — COMPREHENSIVE METABOLIC PANEL
ALBUMIN: 5.1 g/dL — AB (ref 3.6–4.8)
ALT: 21 IU/L (ref 0–44)
AST: 19 IU/L (ref 0–40)
Albumin/Globulin Ratio: 2.1 (ref 1.2–2.2)
Alkaline Phosphatase: 76 IU/L (ref 39–117)
BILIRUBIN TOTAL: 0.5 mg/dL (ref 0.0–1.2)
BUN / CREAT RATIO: 18 (ref 10–24)
BUN: 20 mg/dL (ref 8–27)
CO2: 25 mmol/L (ref 20–29)
CREATININE: 1.11 mg/dL (ref 0.76–1.27)
Calcium: 10.4 mg/dL — ABNORMAL HIGH (ref 8.6–10.2)
Chloride: 100 mmol/L (ref 96–106)
GFR calc non Af Amer: 68 mL/min/{1.73_m2} (ref >59)
GFR, EST AFRICAN AMERICAN: 79 mL/min/{1.73_m2} (ref >59)
GLUCOSE: 89 mg/dL (ref 65–99)
Globulin, Total: 2.4 g/dL (ref 1.5–4.5)
Potassium: 4.9 mmol/L (ref 3.5–5.2)
Sodium: 139 mmol/L (ref 134–144)
TOTAL PROTEIN: 7.5 g/dL (ref 6.0–8.5)

## 2018-10-01 LAB — LIPID PANEL
CHOL/HDL RATIO: 3 ratio (ref 0.0–5.0)
Cholesterol, Total: 167 mg/dL (ref 100–199)
HDL: 55 mg/dL (ref >39)
LDL CALC: 99 mg/dL (ref 0–99)
Triglycerides: 67 mg/dL (ref 0–149)
VLDL CHOLESTEROL CAL: 13 mg/dL (ref 5–40)

## 2018-10-01 LAB — CBC WITH DIFFERENTIAL/PLATELET
BASOS ABS: 0 10*3/uL (ref 0.0–0.2)
Basos: 0 %
EOS (ABSOLUTE): 0.2 10*3/uL (ref 0.0–0.4)
Eos: 2 %
HEMATOCRIT: 46.9 % (ref 37.5–51.0)
Hemoglobin: 15.8 g/dL (ref 13.0–17.7)
Immature Grans (Abs): 0 10*3/uL (ref 0.0–0.1)
Immature Granulocytes: 0 %
LYMPHS ABS: 1.9 10*3/uL (ref 0.7–3.1)
Lymphs: 21 %
MCH: 32.2 pg (ref 26.6–33.0)
MCHC: 33.7 g/dL (ref 31.5–35.7)
MCV: 96 fL (ref 79–97)
MONOS ABS: 0.6 10*3/uL (ref 0.1–0.9)
Monocytes: 7 %
Neutrophils Absolute: 6.2 10*3/uL (ref 1.4–7.0)
Neutrophils: 70 %
Platelets: 184 10*3/uL (ref 150–450)
RBC: 4.91 x10E6/uL (ref 4.14–5.80)
RDW: 12.3 % (ref 12.3–15.4)
WBC: 9 10*3/uL (ref 3.4–10.8)

## 2018-10-01 LAB — PSA: PROSTATE SPECIFIC AG, SERUM: 3.4 ng/mL (ref 0.0–4.0)

## 2018-11-13 ENCOUNTER — Other Ambulatory Visit: Payer: Self-pay | Admitting: Internal Medicine

## 2018-11-13 NOTE — Telephone Encounter (Signed)
Pt's wife called asking for medication for hemorroids, she stated that preparation H is not helping pt anymore. She stated that pt cannot come for ov until next year due to insurance coverage, [facility] [address].

## 2018-11-13 NOTE — Telephone Encounter (Signed)
Informed patient unfortunately Dr. Carlean Purl has not seen him psince 2014 and would need to see him in the office before prescribing a medication to treat hemorrhoids. Encouraged patient to schedule an appointment so we can treat him properly. Patient verbalized understanding.

## 2018-12-21 ENCOUNTER — Other Ambulatory Visit: Payer: Self-pay | Admitting: Acute Care

## 2018-12-21 DIAGNOSIS — Z122 Encounter for screening for malignant neoplasm of respiratory organs: Secondary | ICD-10-CM

## 2018-12-21 DIAGNOSIS — F17210 Nicotine dependence, cigarettes, uncomplicated: Secondary | ICD-10-CM

## 2018-12-21 DIAGNOSIS — Z87891 Personal history of nicotine dependence: Secondary | ICD-10-CM

## 2019-01-06 ENCOUNTER — Ambulatory Visit (INDEPENDENT_AMBULATORY_CARE_PROVIDER_SITE_OTHER): Payer: Medicare Other | Admitting: Acute Care

## 2019-01-06 ENCOUNTER — Ambulatory Visit (INDEPENDENT_AMBULATORY_CARE_PROVIDER_SITE_OTHER)
Admission: RE | Admit: 2019-01-06 | Discharge: 2019-01-06 | Disposition: A | Discharge status: Home / Self Care | Payer: Medicare Other | Source: Ambulatory Visit | Attending: Acute Care | Admitting: Acute Care

## 2019-01-06 ENCOUNTER — Encounter: Payer: Self-pay | Admitting: Acute Care

## 2019-01-06 VITALS — BP 128/76 | HR 67 | Ht 69.0 in | Wt 163.8 lb

## 2019-01-06 DIAGNOSIS — Z87891 Personal history of nicotine dependence: Secondary | ICD-10-CM | POA: Diagnosis not present

## 2019-01-06 DIAGNOSIS — Z122 Encounter for screening for malignant neoplasm of respiratory organs: Secondary | ICD-10-CM

## 2019-01-06 DIAGNOSIS — F17210 Nicotine dependence, cigarettes, uncomplicated: Secondary | ICD-10-CM | POA: Diagnosis not present

## 2019-01-06 NOTE — Progress Notes (Signed)
Shared Decision Making Visit Lung Cancer Screening Program [phone])   Eligibility:  Age 68 y.o.  Pack Years Smoking History Calculation 36 pack year smoking history (# packs/per year x # years smoked)  Recent History of coughing up blood  no  Unexplained weight loss? no ( >Than 15 pounds within the last 6 months )  Prior History Lung / other cancer no (Diagnosis within the last 5 years already requiring surveillance chest CT Scans).  Smoking Status Current Smoker  Former Smokers: Years since quit: NA  Quit Date: NA  Visit Components:  Discussion included one or more decision making aids. yes  Discussion included risk/benefits of screening. yes  Discussion included potential follow up diagnostic testing for abnormal scans. yes  Discussion included meaning and risk of over diagnosis. yes  Discussion included meaning and risk of False Positives. yes  Discussion included meaning of total radiation exposure. yes  Counseling Included:  Importance of adherence to annual lung cancer LDCT screening. yes  Impact of comorbidities on ability to participate in the program. yes  Ability and willingness to under diagnostic treatment. yes  Smoking Cessation Counseling:  Current Smokers:   Discussed importance of smoking cessation. yes  Information about tobacco cessation classes and interventions provided to patient.   Patient provided with "ticket" for LDCT Scan. yes  Symptomatic Patient. no  Counseling NA  Diagnosis Code: Tobacco Use Z72.0  Asymptomatic Patient yes  Counseling (Intermediate counseling: > three minutes counseling) W1191  Former Smokers:   Discussed the importance of maintaining cigarette abstinence. yes  Diagnosis Code: Personal History of Nicotine Dependence. Y78.295  Information about tobacco cessation classes and interventions provided to patient. Yes  Patient provided with "ticket" for LDCT Scan. yes  Written Order for Lung Cancer  Screening with LDCT placed in Epic. Yes (CT Chest Lung Cancer Screening Low Dose W/O CM) [id number] Z12.2-Screening of respiratory organs Z87.891-Personal history of nicotine dependence  I have spent 25 minutes of face to face time with Mr. Toran discussing the risks and benefits of lung cancer screening. We viewed a power point together that explained in detail the above noted topics. We paused at intervals to allow for questions to be asked and answered to ensure understanding.We discussed that the single most powerful action that he can take to decrease his risk of developing lung cancer is to quit smoking. We discussed whether or not he is ready to commit to setting a quit date. We discussed options for tools to aid in quitting smoking including nicotine replacement therapy, non-nicotine medications, support groups, Quit Smart classes, and behavior modification. We discussed that often times setting smaller, more achievable goals, such as eliminating 1 cigarette a day for a week and then 2 cigarettes a day for a week can be helpful in slowly decreasing the number of cigarettes smoked. This allows for a sense of accomplishment as well as providing a clinical benefit. I gave him the " Be Stronger Than Your Excuses" card with contact information for community resources, classes, free nicotine replacement therapy, and access to mobile apps, text messaging, and on-line smoking cessation help. I have also given him my card and contact information in the event he needs to contact me. We discussed the time and location of the scan, and that either Doroteo Glassman RN or I will call with the results within 24-48 hours of receiving them. I have offered him  a copy of the power point we viewed  as a resource in the event they need reinforcement  of the concepts we discussed today in the office. The patient verbalized understanding of all of  the above and had no further questions upon leaving the office. They have my  contact information in the event they have any further questions.  I spent 3 minutes counseling on smoking cessation and the health risks of continued tobacco abuse.  I explained to the patient that there has been a high incidence of coronary artery disease noted on these exams. I explained that this is a non-gated exam therefore degree or severity cannot be determined. This patient is currently on statin therapy. I have asked the patient to follow-up with their PCP regarding any incidental finding of coronary artery disease and management with diet or medication as their PCP  feels is clinically indicated. The patient verbalized understanding of the above and had no further questions upon completion of the visit.      Magdalen Spatz, NP 01/06/2019 1:12 PM

## 2019-01-08 ENCOUNTER — Other Ambulatory Visit: Payer: Self-pay | Admitting: Acute Care

## 2019-01-08 DIAGNOSIS — F17210 Nicotine dependence, cigarettes, uncomplicated: Secondary | ICD-10-CM

## 2019-01-08 DIAGNOSIS — Z122 Encounter for screening for malignant neoplasm of respiratory organs: Secondary | ICD-10-CM

## 2019-01-08 DIAGNOSIS — Z87891 Personal history of nicotine dependence: Secondary | ICD-10-CM

## 2019-10-04 DIAGNOSIS — I2510 Atherosclerotic heart disease of native coronary artery without angina pectoris: Secondary | ICD-10-CM | POA: Insufficient documentation

## 2019-10-04 DIAGNOSIS — I700 Atherosclerosis of aorta: Secondary | ICD-10-CM | POA: Insufficient documentation

## 2019-10-04 NOTE — Patient Instructions (Addendum)
  HEALTH MAINTENANCE RECOMMENDATIONS:  It is recommended that you get at least 30 minutes of aerobic exercise at least 5 days/week (for weight loss, you may need as much as 60-90 minutes). This can be any activity that gets your heart rate up. This can be divided in 10-15 minute intervals if needed, but try and build up your endurance at least once a week.  Weight bearing exercise is also recommended twice weekly.  Eat a healthy diet with lots of vegetables, fruits and fiber.  "Colorful" foods have a lot of vitamins (ie green vegetables, tomatoes, red peppers, etc).  Limit sweet tea, regular sodas and alcoholic beverages, all of which has a lot of calories and sugar.  Up to 2 alcoholic drinks daily may be beneficial for men (unless trying to lose weight, watch sugars).  Drink a lot of water.  Sunscreen of at least SPF 30 should be used on all sun-exposed parts of the skin when outside between the hours of 10 am and 4 pm (not just when at beach or pool, but even with exercise, golf, tennis, and yard work!)  Use a sunscreen that says "broad spectrum" so it covers both UVA and UVB rays, and make sure to reapply every 1-2 hours.  Remember to change the batteries in your smoke detectors when changing your clock times in the spring and fall. Carbon monoxide detectors are recommended for your home.  Use your seat belt every time you are in a car, and please drive safely and not be distracted with cell phones and texting while driving.    Travis Baldwin , Thank you for taking time to come for your Welcome to  Medicare Visit. I appreciate your ongoing commitment to your health goals. Please review the following plan we discussed and let me know if I can assist you in the future.   This is a list of the screening recommended for you and due dates:  Health Maintenance  Topic Date Due  . Flu Shot  07/10/2019  . Colon Cancer Screening  07/23/2020  . Tetanus Vaccine  09/30/2028  .  Hepatitis C: One time  screening is recommended by Center for Disease Control  (CDC) for  adults born from [birth year] through 1965.   Completed  . Pneumonia vaccines  Completed   Flu shot was given today.  Please start taking an enteric-coated aspirin 81mg  once daily, to help prevent cardiovascular disease (heart attack and stroke), given that we see atheroclerosis in the aorta and coronary arteries.  Please try and quit smoking--start thinking about why/when you smoke (habit, boredom, stress) in order to come up with effective strategies to cut back or quit. Available resources to help you quit include free counseling through [facility] Quitline ([place]Quitline.com or 1-800-QUITNOW), smoking cessation classes through [facility] (call to find out schedule), over-the-counter nicotine replacements, and e-cigarettes (although this may not help break the hand-mouth habit).  Many insurance companies also have smoking cessation programs (which may decrease the cost of patches, meds if enrolled).  If these methods are not effective for you, and you are motivated to quit, return to discuss the possibility of prescription medications.

## 2019-10-04 NOTE — Progress Notes (Addendum)
Chief Complaint  Patient presents with  . Medicare Wellness    fasting AWV/CPE no concerns. Will schedule eye appt, place he was going closed down.     Travis Baldwin is a 68 y.o. male who presents for Welcome to Medicare physical and follow-up on chronic medical conditions.  He retired last year and has done many home projects.  Hyperlipidemia follow-up: Patient is reportedly following a low-fat, low cholesterol diet.Denies any changes in diet since retiring, just less active. Compliant with taking lipitor, along with CoQ10. Fasting for lipids today. Lab Results  Component Value Date   CHOL 167 09/30/2018   HDL 55 09/30/2018   LDLCALC 99 09/30/2018   TRIG 67 09/30/2018   CHOLHDL 3.0 09/30/2018    Smoking--he continues to smoke, as does his wife. He is back up to 1/2 PPD (hadd cut back to 1 pack/week last year). Smoking since age 16, close to a pack/day for over 39 years. He hasn't set a quit date. He had CT screening in 12/2018: IMPRESSION: Lung-RADS 2, benign appearance or behavior. Continue annual screening with low-dose chest CT without contrast in 12 months.  Aortic Atherosclerosis (ICD10-I70.0) and Emphysema (ICD10-J43.9). Mild coronary atherosclerosis of the LAD was noted on that CT. He denies exertional chest pain or shortness of breath.  Occasional muscle cramps in the left calf, only at night. This last occurred about 3  Months ago.  He will drink some water and take OTC 2 potassium pills and it goes away.   Immunization History  Administered Date(s) Administered  . Influenza Split 09/09/2011, 10/09/2012  . Influenza Whole 09/09/1989, 12/05/1999  . Influenza, High Dose Seasonal PF 09/12/2016, 09/30/2018  . Influenza,inj,Quad PF,6+ Mos 09/29/2013, 12/21/2014, 11/16/2015  . PPD Test 09/10/1995  . Pneumococcal Conjugate-13 09/12/2016  . Pneumococcal Polysaccharide-23 09/10/1995, 09/09/2011, 09/24/2017  . Td 11/23/1997  . Tdap 01/10/2009, 09/30/2018  . Zoster  12/21/2014  . Zoster Recombinat (Shingrix) 09/30/2018, 12/30/2018   Last colonoscopy: 07/2013, due again 07/2020 Last PSA: 3.4 in 09/2018 Lab Results  Component Value Date   PSA 2.8 09/24/2017   PSA 3.4 09/12/2016   PSA 2.51 12/21/2014   Exercise: less active since he retired.  Works outside a lot.  Wife retires tomorrow and they plan to start exercising together daily in the morning. Walks the dog in the evenings (very slow). Ophtho: yearly. Wears one contact for reading and one for distance.  Dentist: wears dentures, went to dentist4 years agofor oral exam.    Other doctors caring for patient include: GI: Dr. Carlean Purl Ophtho:[facility] on [address] (different doctor each year)-will be changing, possibly closed Dentist--Dr. Jamie Kato. Went4 years ago for oral exam Neurosurgeon--Dr. Kathyrn Sheriff Urologist--[facility] (hasn't been in many years, was for eval of microscopic hematuria)  Depression and Fall screens are negative Functional Status survey is unremarkable Mini-cog screen normal  End of Life Discussion: Patient does not havea living will and medical power of attorney  Past Medical History:  Diagnosis Date  . Colon polyp    hyperplastic, adenomatous (2008); Dr. Carlean Purl  . Diverticulosis   . Hyperlipidemia   . Impaired fasting glucose 02/2006  . Internal hemorrhoids Grade 2 prolapsing with bleeding 09/03/2013  . Microscopic hematuria    followed by [facility]; normal cystoscopy 05/2009  . Smoker     Past Surgical History:  Procedure Laterality Date  . CHEST TUBE INSERTION  1996   pockets of infection on outside of lung  . COLONOSCOPY  multiple; 07/2013   Dr.  Carlean Purl  . HEMORRHOID BANDING  09/2013   Dr. Carlean Purl    Social History   Socioeconomic History  . Marital status: Married    Spouse name: Clarene Critchley  . Number of children: 0  . Years of education: Not on file  . Highest education level: Not on file  Occupational History  .  Occupation: shipping/receiving for Nurse, adult company    Employer: [employer]  . Financial resource strain: Not on file  . Food insecurity    Worry: Not on file    Inability: Not on file  . Transportation needs    Medical: Not on file    Non-medical: Not on file  Tobacco Use  . Smoking status: Current Every Day Smoker    Packs/day: 0.50    Years: 39.00    Pack years: 19.50    Types: Cigarettes  . Smokeless tobacco: Never Used  Substance and Sexual Activity  . Alcohol use: Yes    Alcohol/week: 10.0 standard drinks    Types: 10 Shots of liquor per week    Comment: 1 drink each night (rum and coke), 4-5x/week, 1 shot per drink.  . Drug use: No  . Sexual activity: Yes    Partners: Female  Lifestyle  . Physical activity    Days per week: Not on file    Minutes per session: Not on file  . Stress: Not on file  Relationships  . Social Herbalist on phone: Not on file    Gets together: Not on file    Attends religious service: Not on file    Active member of club or organization: Not on file    Attends meetings of clubs or organizations: Not on file    Relationship status: Not on file  . Intimate partner violence    Fear of current or ex partner: Not on file    Emotionally abused: Not on file    Physically abused: Not on file    Forced sexual activity: Not on file  Other Topics Concern  . Not on file  Social History Narrative   Married, lives with wife, 1 dog   Retired 10/2018    Family History  Problem Relation Age of Onset  . Hypertension Mother   . Hyperlipidemia Mother   . Cancer Father        bone (jaw)  . Cancer Brother        sarcoma  . Colon cancer Brother        sarcoma around colon  . Diabetes Maternal Aunt   . Diabetes Maternal Grandfather     Outpatient Encounter Medications as of 10/06/2019  Medication Sig Note  . atorvastatin (LIPITOR) 20 MG tablet Take 1 tablet (20 mg total) by mouth daily.   Marland Kitchen  co-enzyme Q-10 30 MG capsule Take 30 mg by mouth daily.    . Multiple Vitamins-Minerals (MULTIVITAMIN WITH MINERALS) tablet Take 1 tablet by mouth daily.   Marland Kitchen OVER THE COUNTER MEDICATION Take 1 tablet by mouth 2 (two) times daily. 09/24/2017: Super beta prostate  . Aspirin-Salicylamide-Caffeine (BC HEADACHE POWDER PO) Take 1 packet by mouth as needed. Reported on 05/13/2016 06/01/2014: Not taking; uses occasionally prn headache  . Potassium Chloride (K+ POTASSIUM PO) Take 1 tablet by mouth daily as needed (muscle cramps). 09/30/2018: Uses prn, every few days to a week   No facility-administered encounter medications on file as of 10/06/2019.     Allergies  Allergen Reactions  .  Codeine Hives  . Vicodin [Hydrocodone-Acetaminophen] Other (See Comments)    Shakiness.    ROS: The patient denies anorexia, fever, weight changes, headaches, vision loss,decreased hearing, ear pain, hoarseness, chest pain, palpitations, dizziness, syncope, dyspnea on exertion, cough, swelling, nausea, vomiting, diarrhea, constipation, abdominal pain, melena, indigestion/heartburn, gross hematuria, incontinence, erectile dysfunction, nocturia, genital lesions, weakness, tremor, suspicious skin lesions, depression, anxiety, abnormal bleeding/bruising, or enlarged lymph nodes. Slightly weakened urinary stream, otherwise no urinary complaints since taking prostate supplements.  Gets up once to void. Hands fall asleep sometimes when sleeping (clenches fists, better when he changes positions). Occasionally he notices blood in the stool (sees on toilet paper), denies constipation. Sees this more often after a looser stool. Occurs about 1x/month. This hasn't changed in the last year. +calf cramp at night, on the left only. Uses potassium supplement after getting, which he thinks helps.  Less often, only every few months. Some pain in the right shoulder, reaching behind him or lifting overhead.. Similar problem in the past on  the left which resolved. 10# weight gain in the last year   PHYSICAL EXAM:  BP 130/80   Pulse 64   Temp (!) 96.9 F (36.1 C) (Other (Comment))   Ht 5' 9"  (1.753 m)   Wt 167 lb 3.2 oz (75.8 kg)   BMI 24.69 kg/m    Wt Readings from Last 3 Encounters:  10/06/19 167 lb 3.2 oz (75.8 kg)  01/06/19 163 lb 12.8 oz (74.3 kg)  09/30/18 157 lb 3.2 oz (71.3 kg)    General Appearance:  Alert, cooperative, no distress, appears stated age   Head:  Normocephalic, without obvious abnormality, atraumatic   Eyes:  PERRL, conjunctiva/corneas clear, EOM's intact, fundi benign   Ears:  Normal TM's and external ear canals.  Nose:  Not examined, wearing mask due to COVID-19 pandemic  Throat:  Not examined, wearing mask due to COVID-19 pandemic  Neck:  Supple, no lymphadenopathy; thyroid: no enlargement/tenderness/ nodules; no carotid bruit or JVD   Back:  Spine nontender, no curvature, ROM normal, no CVA tenderness   Lungs:  Clear to auscultation bilaterally without wheezes, rales or ronchi; respirations unlabored   Chest Wall:  No tenderness or deformity   Heart:  Regular rate and rhythm, S1 and S2 normal, no murmur, rub or gallop   Breast Exam:  No chest wall tenderness, masses or gynecomastia   Abdomen:  Soft, non-tender, nondistended, normoactive bowel sounds, no masses, no hepatosplenomegaly   Genitalia:  Normal male external genitalia without lesions. Testicles without masses. No inguinal hernias.   Rectal:  Normal sphincter tone, no masses or tenderness; heme negative stool.Prostate smooth, no nodules, normal size. External hemorrhoids are not inflamed  Extremities:  No clubbing, cyanosis or edema   Pulses:  2+ and symmetric all extremities   Skin:  Skin color, texture, turgor normal, no rashes or lesions. Mole R cheek previously described as "oddly shaped (like L-quote)", uniform color. It actually now appears to be two separate moles, both mostly round,  uniform in light brown color.  Lymph nodes:  Cervical, supraclavicular, and axillary nodes normal   Neurologic:  CNII-XII intact, normal strength, sensation and gait; reflexes 2+ and symmetric throughout          Psych: Normal mood, affect, hygiene and grooming   Spirometry: mild obstruction EKG: sinus brady; TWI in V1 and V2.  No priors for comparison   ASSESSMENT/PLAN:  Welcome to Medicare preventive visit - Plan: CBC with Differential/Platelet, Comprehensive metabolic panel, Lipid panel,  PSA, EKG 12-Lead  Pure hypercholesterolemia - Plan: Lipid panel  Tobacco use disorder - risks reviewed, encouraged cessation, counseled  Screening for prostate cancer - Plan: PSA  Medication monitoring encounter - Plan: CBC with Differential/Platelet, Comprehensive metabolic panel, Lipid panel  Aortic atherosclerosis (HCC) - start ASA 16m, cont statin  Atherosclerosis of native coronary artery of native heart without angina pectoris - Plan: Ambulatory referral to Cardiology  Pulmonary emphysema, unspecified emphysema type ([place] - noted on chest CT 12/2018; mild obstruction noted on spirometry today. Encouraged smoking cessation - Plan: Spirometry with Graph  Need for influenza vaccination - Plan: Flu Vaccine QUAD High Dose(Fluad)  Abnormal EKG - Plan: Ambulatory referral to Cardiology  Coronary atherosclerosis (LAD) noted on lung CA screening CT scan. Maximize therapy (started ASA 847m check LDL, cont lipitor--(consider increasing dose if LDL remains over 70), BP is controlled); Cardiology eval due to EKG abnl.   PSA screening, risks/benefits reviewed, recommended at least 30 minutes of aerobic activity at least 5 days/week, weight-bearing exercise at least 2x/week (gets at work); proper sunscreen use reviewed; healthy diet and alcohol recommendations (less than or equal to 2 drinks/day) reviewed; regular seatbelt use; changing batteries in smoke detectors. Immunization  recommendations discussed--high dose flu shot given.  Colonoscopy recommendations reviewed--UTD, due again 07/2020 Counseled regarding smoking cessation  Living will and healthcare power of attorney paperwork were given and discussed again.  MOST form discussed and filled out. Full Code, Full Care  c-met, lipid, PSA, CBC  F/u 1 year for CPE/AWV  Addendum: Lipids higher.  Atorvastatin increased to 4062mab Results  Component Value Date   CHOL 176 10/06/2019   HDL 51 10/06/2019   LDLCALC 111 (H) 10/06/2019   TRIG 77 10/06/2019   CHOLHDL 3.5 10/06/2019     Medicare Attestation I have personally reviewed: The patient's medical and social history Their use of alcohol, tobacco or illicit drugs Their current medications and supplements The patient's functional ability including ADLs,fall risks, home safety risks, cognitive, and hearing and visual impairment Diet and physical activities Evidence for depression or mood disorders  The patient's weight, height, BMI, and visual acuity have been recorded in the chart.  I have made referrals, counseling, and provided education to the patient based on review of the above and I have provided the patient with a written personalized care plan for preventive services.

## 2019-10-06 ENCOUNTER — Telehealth: Payer: Self-pay | Admitting: *Deleted

## 2019-10-06 ENCOUNTER — Other Ambulatory Visit: Payer: Self-pay

## 2019-10-06 ENCOUNTER — Ambulatory Visit (INDEPENDENT_AMBULATORY_CARE_PROVIDER_SITE_OTHER): Payer: Medicare Other | Admitting: Family Medicine

## 2019-10-06 ENCOUNTER — Encounter: Payer: Self-pay | Admitting: Family Medicine

## 2019-10-06 VITALS — BP 130/80 | HR 64 | Temp 96.9°F | Ht 69.0 in | Wt 167.2 lb

## 2019-10-06 DIAGNOSIS — I700 Atherosclerosis of aorta: Secondary | ICD-10-CM

## 2019-10-06 DIAGNOSIS — J439 Emphysema, unspecified: Secondary | ICD-10-CM

## 2019-10-06 DIAGNOSIS — R9431 Abnormal electrocardiogram [ECG] [EKG]: Secondary | ICD-10-CM

## 2019-10-06 DIAGNOSIS — F17200 Nicotine dependence, unspecified, uncomplicated: Secondary | ICD-10-CM

## 2019-10-06 DIAGNOSIS — Z0000 Encounter for general adult medical examination without abnormal findings: Secondary | ICD-10-CM | POA: Diagnosis not present

## 2019-10-06 DIAGNOSIS — Z23 Encounter for immunization: Secondary | ICD-10-CM

## 2019-10-06 DIAGNOSIS — I2510 Atherosclerotic heart disease of native coronary artery without angina pectoris: Secondary | ICD-10-CM

## 2019-10-06 DIAGNOSIS — Z5181 Encounter for therapeutic drug level monitoring: Secondary | ICD-10-CM | POA: Diagnosis not present

## 2019-10-06 DIAGNOSIS — E7800 Pure hypercholesterolemia, unspecified: Secondary | ICD-10-CM | POA: Diagnosis not present

## 2019-10-06 DIAGNOSIS — Z125 Encounter for screening for malignant neoplasm of prostate: Secondary | ICD-10-CM | POA: Diagnosis not present

## 2019-10-06 NOTE — Telephone Encounter (Signed)
-----   Message from Rita Ohara, MD sent at 10/06/2019  1:38 PM EDT ----- I sent his EKG results and discussed cardiology consult through MyChart--not sure if he will check it right away or if you should all and let him know that due to some abnormalities on EKG, he is being sent for consult (we already discussed the atherosclerosis of coronary arteries seen on CT)

## 2019-10-06 NOTE — Telephone Encounter (Signed)
I did contact patient and let him know about EKG and consult.

## 2019-10-07 LAB — CBC WITH DIFFERENTIAL/PLATELET
Basophils Absolute: 0 10*3/uL (ref 0.0–0.2)
Basos: 0 %
EOS (ABSOLUTE): 0.2 10*3/uL (ref 0.0–0.4)
Eos: 3 %
Hematocrit: 48.4 % (ref 37.5–51.0)
Hemoglobin: 16 g/dL (ref 13.0–17.7)
Immature Grans (Abs): 0 10*3/uL (ref 0.0–0.1)
Immature Granulocytes: 0 %
Lymphocytes Absolute: 2 10*3/uL (ref 0.7–3.1)
Lymphs: 26 %
MCH: 32.2 pg (ref 26.6–33.0)
MCHC: 33.1 g/dL (ref 31.5–35.7)
MCV: 97 fL (ref 79–97)
Monocytes Absolute: 0.6 10*3/uL (ref 0.1–0.9)
Monocytes: 7 %
Neutrophils Absolute: 4.8 10*3/uL (ref 1.4–7.0)
Neutrophils: 64 %
Platelets: 175 10*3/uL (ref 150–450)
RBC: 4.97 x10E6/uL (ref 4.14–5.80)
RDW: 12.3 % (ref 11.6–15.4)
WBC: 7.6 10*3/uL (ref 3.4–10.8)

## 2019-10-07 LAB — COMPREHENSIVE METABOLIC PANEL
ALT: 32 IU/L (ref 0–44)
AST: 25 IU/L (ref 0–40)
Albumin/Globulin Ratio: 1.9 (ref 1.2–2.2)
Albumin: 5 g/dL — ABNORMAL HIGH (ref 3.8–4.8)
Alkaline Phosphatase: 85 IU/L (ref 39–117)
BUN/Creatinine Ratio: 17 (ref 10–24)
BUN: 21 mg/dL (ref 8–27)
Bilirubin Total: 0.4 mg/dL (ref 0.0–1.2)
CO2: 25 mmol/L (ref 20–29)
Calcium: 10.2 mg/dL (ref 8.6–10.2)
Chloride: 101 mmol/L (ref 96–106)
Creatinine, Ser: 1.21 mg/dL (ref 0.76–1.27)
GFR calc Af Amer: 71 mL/min/{1.73_m2} (ref >59)
GFR calc non Af Amer: 61 mL/min/{1.73_m2} (ref >59)
Globulin, Total: 2.7 g/dL (ref 1.5–4.5)
Glucose: 94 mg/dL (ref 65–99)
Potassium: 4.5 mmol/L (ref 3.5–5.2)
Sodium: 140 mmol/L (ref 134–144)
Total Protein: 7.7 g/dL (ref 6.0–8.5)

## 2019-10-07 LAB — LIPID PANEL
Chol/HDL Ratio: 3.5 ratio (ref 0.0–5.0)
Cholesterol, Total: 176 mg/dL (ref 100–199)
HDL: 51 mg/dL (ref >39)
LDL Chol Calc (NIH): 111 mg/dL — ABNORMAL HIGH (ref 0–99)
Triglycerides: 77 mg/dL (ref 0–149)
VLDL Cholesterol Cal: 14 mg/dL (ref 5–40)

## 2019-10-07 LAB — PSA: Prostate Specific Ag, Serum: 3.9 ng/mL (ref 0.0–4.0)

## 2019-10-07 MED ORDER — ATORVASTATIN CALCIUM 40 MG PO TABS: 20.0000 mg | ORAL_TABLET | Freq: Every day | ORAL | 0 refills | Status: DC

## 2019-10-07 MED ORDER — ATORVASTATIN CALCIUM 40 MG PO TABS: 40.0000 mg | ORAL_TABLET | Freq: Every day | ORAL | 0 refills | Status: DC

## 2019-10-07 NOTE — Addendum Note (Signed)
Addended by: Rita Ohara on: 10/07/2019 08:19 AM   Modules accepted: Orders

## 2019-10-27 ENCOUNTER — Encounter: Payer: Self-pay | Admitting: Family Medicine

## 2019-11-26 ENCOUNTER — Other Ambulatory Visit: Payer: Self-pay | Admitting: Family Medicine

## 2019-11-26 DIAGNOSIS — E7800 Pure hypercholesterolemia, unspecified: Secondary | ICD-10-CM

## 2019-12-22 ENCOUNTER — Other Ambulatory Visit: Payer: Medicare Other

## 2019-12-22 ENCOUNTER — Other Ambulatory Visit: Payer: Self-pay

## 2019-12-22 ENCOUNTER — Telehealth: Payer: Self-pay | Admitting: Internal Medicine

## 2019-12-22 DIAGNOSIS — Z5181 Encounter for therapeutic drug level monitoring: Secondary | ICD-10-CM

## 2019-12-22 DIAGNOSIS — E7800 Pure hypercholesterolemia, unspecified: Secondary | ICD-10-CM | POA: Diagnosis not present

## 2019-12-22 NOTE — Telephone Encounter (Signed)
Orders were placed through today's lab visit (so they should print directly and not have to be released)--that was MY error (sorry!)

## 2019-12-22 NOTE — Telephone Encounter (Signed)
Pt here today for labs and no future orders places. Verdis Frederickson has drawn and hold

## 2019-12-23 LAB — LIPID PANEL
Chol/HDL Ratio: 3.2 ratio (ref 0.0–5.0)
Cholesterol, Total: 155 mg/dL (ref 100–199)
HDL: 48 mg/dL (ref >39)
LDL Chol Calc (NIH): 93 mg/dL (ref 0–99)
Triglycerides: 73 mg/dL (ref 0–149)
VLDL Cholesterol Cal: 14 mg/dL (ref 5–40)

## 2019-12-23 LAB — HEPATIC FUNCTION PANEL
ALT: 28 IU/L (ref 0–44)
AST: 23 IU/L (ref 0–40)
Albumin: 4.6 g/dL (ref 3.8–4.8)
Alkaline Phosphatase: 80 IU/L (ref 39–117)
Bilirubin Total: 0.3 mg/dL (ref 0.0–1.2)
Bilirubin, Direct: 0.1 mg/dL (ref 0.00–0.40)
Total Protein: 7 g/dL (ref 6.0–8.5)

## 2020-01-03 ENCOUNTER — Other Ambulatory Visit: Payer: Self-pay | Admitting: Family Medicine

## 2020-01-03 DIAGNOSIS — E7800 Pure hypercholesterolemia, unspecified: Secondary | ICD-10-CM

## 2020-01-14 ENCOUNTER — Ambulatory Visit: Payer: Medicare Other | Admitting: Cardiology

## 2020-01-14 ENCOUNTER — Encounter: Payer: Self-pay | Admitting: Cardiology

## 2020-01-14 ENCOUNTER — Other Ambulatory Visit: Payer: Self-pay

## 2020-01-14 ENCOUNTER — Encounter: Payer: Self-pay | Admitting: Family Medicine

## 2020-01-14 ENCOUNTER — Ambulatory Visit (INDEPENDENT_AMBULATORY_CARE_PROVIDER_SITE_OTHER)
Admission: RE | Admit: 2020-01-14 | Discharge: 2020-01-14 | Disposition: A | Discharge status: Home / Self Care | Payer: Medicare Other | Source: Ambulatory Visit | Attending: Acute Care | Admitting: Acute Care

## 2020-01-14 VITALS — BP 122/76 | HR 75 | Ht 70.0 in | Wt 170.4 lb

## 2020-01-14 DIAGNOSIS — F17210 Nicotine dependence, cigarettes, uncomplicated: Secondary | ICD-10-CM

## 2020-01-14 DIAGNOSIS — I2510 Atherosclerotic heart disease of native coronary artery without angina pectoris: Secondary | ICD-10-CM | POA: Diagnosis not present

## 2020-01-14 DIAGNOSIS — F17200 Nicotine dependence, unspecified, uncomplicated: Secondary | ICD-10-CM

## 2020-01-14 DIAGNOSIS — Z122 Encounter for screening for malignant neoplasm of respiratory organs: Secondary | ICD-10-CM

## 2020-01-14 DIAGNOSIS — R9431 Abnormal electrocardiogram [ECG] [EKG]: Secondary | ICD-10-CM

## 2020-01-14 DIAGNOSIS — E785 Hyperlipidemia, unspecified: Secondary | ICD-10-CM | POA: Diagnosis not present

## 2020-01-14 DIAGNOSIS — I2584 Coronary atherosclerosis due to calcified coronary lesion: Secondary | ICD-10-CM | POA: Diagnosis not present

## 2020-01-14 DIAGNOSIS — Z87891 Personal history of nicotine dependence: Secondary | ICD-10-CM

## 2020-01-14 NOTE — Progress Notes (Signed)
Cardiology Office Note:    Date:  01/14/2020   ID:  Travis Baldwin, DOB 1951-02-11, MRN [id number]  PCP:  Rita Ohara, MD  Cardiologist:  No primary care provider on file.  Electrophysiologist:  None   Referring MD: Rita Ohara, MD   Reason for consult: Abnormal EKG  History of Present Illness:    Travis Baldwin is a very pleasant 69 y.o. male with a hx of smoking, hyperlipidemia, who was sent by his primary care physician Dr. Tomi Bamberger for abnormal EKG, the patient states that he has retired a year ago, but remained very active, he walks daily and denies any symptoms of chest pain or shortness of breath.  He denies any palpitation dizziness or syncope.  He does not have a family history of premature coronary artery disease.  He has recently been started on aspirin and 40 mg of atorvastatin daily that he is tolerating well.  He underwent noncontrast chest CT for lung cancer screening today.  Past Medical History:  Diagnosis Date  . Colon polyp    hyperplastic, adenomatous (2008); Dr. Carlean Purl  . Diverticulosis   . Hyperlipidemia   . Impaired fasting glucose 02/2006  . Internal hemorrhoids Grade 2 prolapsing with bleeding 09/03/2013  . Microscopic hematuria    followed by [facility]; normal cystoscopy 05/2009  . Smoker     Past Surgical History:  Procedure Laterality Date  . CHEST TUBE INSERTION  1996   pockets of infection on outside of lung  . COLONOSCOPY  multiple; 07/2013   Dr. Carlean Purl  . HEMORRHOID BANDING  09/2013   Dr. Carlean Purl    Current Medications: Current Meds  Medication Sig  . aspirin EC 81 MG tablet Take 81 mg by mouth daily.  . Aspirin-Salicylamide-Caffeine (BC HEADACHE POWDER PO) Take 1 packet by mouth as needed. Reported on 05/13/2016  . atorvastatin (LIPITOR) 40 MG tablet TAKE 1 TABLET BY MOUTH EVERY DAY  . co-enzyme Q-10 30 MG capsule Take 30 mg by mouth daily.   . Multiple Vitamins-Minerals (MULTIVITAMIN WITH MINERALS) tablet Take 1 tablet by mouth daily.  Marland Kitchen  OVER THE COUNTER MEDICATION Take 1 tablet by mouth 2 (two) times daily. Pro beta prostate  . Potassium Chloride (K+ POTASSIUM PO) Take 1 tablet by mouth daily as needed (muscle cramps).     Allergies:   Codeine and Vicodin [hydrocodone-acetaminophen]   Social History   Socioeconomic History  . Marital status: Married    Spouse name: Clarene Critchley  . Number of children: 0  . Years of education: Not on file  . Highest education level: Not on file  Occupational History  . Occupation: shipping/receiving for Nurse, adult company    Employer: [employer]  Tobacco Use  . Smoking status: Current Every Day Smoker    Packs/day: 0.50    Years: 39.00    Pack years: 19.50    Types: Cigarettes  . Smokeless tobacco: Never Used  Substance and Sexual Activity  . Alcohol use: Yes    Alcohol/week: 10.0 standard drinks    Types: 10 Shots of liquor per week    Comment: 1 drink each night (rum and coke), 4-5x/week, 1 shot per drink.  . Drug use: No  . Sexual activity: Yes    Partners: Female  Other Topics Concern  . Not on file  Social History Narrative   Married, lives with wife, 1 dog   Retired 10/2018   Social Determinants of Health   Financial Resource Strain:   . Difficulty of  Paying Living Expenses: Not on file  Food Insecurity:   . Worried About Charity fundraiser in the Last Year: Not on file  . Ran Out of Food in the Last Year: Not on file  Transportation Needs:   . Lack of Transportation (Medical): Not on file  . Lack of Transportation (Non-Medical): Not on file  Physical Activity:   . Days of Exercise per Week: Not on file  . Minutes of Exercise per Session: Not on file  Stress:   . Feeling of Stress : Not on file  Social Connections:   . Frequency of Communication with Friends and Family: Not on file  . Frequency of Social Gatherings with Friends and Family: Not on file  . Attends Religious Services: Not on file  . Active Member of Clubs or Organizations: Not  on file  . Attends Archivist Meetings: Not on file  . Marital Status: Not on file     Family History: The patient's family history includes Cancer in his brother and father; Colon cancer in his brother; Diabetes in his maternal aunt and maternal grandfather; Hyperlipidemia in his mother; Hypertension in his mother.  ROS:   Please see the history of present illness.    All other systems reviewed and are negative.  EKGs/Labs/Other Studies Reviewed:    The following studies were reviewed today:  EKG:  EKG is ordered today.  The ekg ordered today demonstrates normal sinus rhythm, normal EKG, this was personally reviewed.  Higher. Recent Labs: 10/06/2019: BUN 21; Creatinine, Ser 1.21; Hemoglobin 16.0; Platelets 175; Potassium 4.5; Sodium 140 12/22/2019: ALT 28  Recent Lipid Panel    Component Value Date/Time   CHOL 155 12/22/2019 0900   TRIG 73 12/22/2019 0900   HDL 48 12/22/2019 0900   CHOLHDL 3.2 12/22/2019 0900   CHOLHDL 2.9 09/24/2017 0947   VLDL 14 09/12/2016 0934   LDLCALC 93 12/22/2019 0900   LDLCALC 87 09/24/2017 0947    Physical Exam:    VS:  BP 122/76   Pulse 75   Ht 5\' 10"  (1.778 m)   Wt 170 lb 6.4 oz (77.3 kg)   SpO2 96%   BMI 24.45 kg/m     Wt Readings from Last 3 Encounters:  01/14/20 170 lb 6.4 oz (77.3 kg)  10/06/19 167 lb 3.2 oz (75.8 kg)  01/06/19 163 lb 12.8 oz (74.3 kg)     GEN:  Well nourished, well developed in no acute distress HEENT: Normal NECK: No JVD; No carotid bruits LYMPHATICS: No lymphadenopathy CARDIAC: RRR, no murmurs, rubs, gallops RESPIRATORY:  Clear to auscultation without rales, wheezing or rhonchi  ABDOMEN: Soft, non-tender, non-distended MUSCULOSKELETAL:  No edema; No deformity  SKIN: Warm and dry NEUROLOGIC:  Alert and oriented x 3 PSYCHIATRIC:  Normal affect   ASSESSMENT:    1. Coronary artery calcification   2. Hyperlipidemia, unspecified hyperlipidemia type   3. Nonspecific abnormal electrocardiogram  (ECG) (EKG)   4. Smoking    PLAN:    In order of problems listed above:  1. Coronary calcifications -I have personally reviewed his noncontrast chest CT performed today for cancer screening, I have showed the images to the patient and explain, he has evidence of coronary calcifications in LAD and RCA, he is highly active asymptomatic already on aspirin and high-dose atorvastatin, I would continue the same management, no stress testing is necessary at this point as the patient has no symptoms I would continue primary prevention.  The patient is explained that  if he develops any signs of angina or shortness of breath or exertional dizziness he should alert [place].  Patient is congratulated on excellent exercise regimen, he is advised to quit smoking. 2. Hyperlipidemia -continue current management with atorvastatin 40 mg daily.  His most recent lipids are at goal and LFTs are normal. 3. Abnormal EKG -repeat EKG in our clinic today appears normal.  Follow-up in 1 year unless the patient has symptoms.  Medication Adjustments/Labs and Tests Ordered: Current medicines are reviewed at length with the patient today.  Concerns regarding medicines are outlined above.  Orders Placed This Encounter  Procedures  . EKG 12-Lead   No orders of the defined types were placed in this encounter.   Patient Instructions  Medication Instructions:   Your physician recommends that you continue on your current medications as directed. Please refer to the Current Medication list given to you today.  *If you need a refill on your cardiac medications before your next appointment, please call your pharmacy*    Follow-Up: At [facility], you and your health needs are our priority.  As part of our continuing mission to provide you with exceptional heart care, we have created designated Provider Care Teams.  These Care Teams include your primary Cardiologist (physician) and Advanced Practice Providers (APPs -   Physician Assistants and Nurse Practitioners) who all work together to provide you with the care you need, when you need it.  Your next appointment:   12 month(s)  The format for your next appointment:   In Person  Provider:   Ena Dawley, MD       Signed, Ena Dawley, MD  01/14/2020 12:58 PM    [place]

## 2020-01-14 NOTE — Patient Instructions (Signed)
Medication Instructions:   Your physician recommends that you continue on your current medications as directed. Please refer to the Current Medication list given to you today.  *If you need a refill on your cardiac medications before your next appointment, please call your pharmacy*    Follow-Up: At [facility], you and your health needs are our priority.  As part of our continuing mission to provide you with exceptional heart care, we have created designated Provider Care Teams.  These Care Teams include your primary Cardiologist (physician) and Advanced Practice Providers (APPs -  Physician Assistants and Nurse Practitioners) who all work together to provide you with the care you need, when you need it.  Your next appointment:   12 month(s)  The format for your next appointment:   In Person  Provider:   Katarina Nelson, MD   

## 2020-01-20 NOTE — Progress Notes (Signed)

## 2020-01-24 ENCOUNTER — Other Ambulatory Visit: Payer: Self-pay | Admitting: *Deleted

## 2020-01-24 DIAGNOSIS — F17210 Nicotine dependence, cigarettes, uncomplicated: Secondary | ICD-10-CM

## 2020-01-24 DIAGNOSIS — Z87891 Personal history of nicotine dependence: Secondary | ICD-10-CM

## 2020-03-30 ENCOUNTER — Other Ambulatory Visit: Payer: Self-pay | Admitting: Family Medicine

## 2020-03-30 DIAGNOSIS — E7800 Pure hypercholesterolemia, unspecified: Secondary | ICD-10-CM

## 2020-08-16 IMAGING — CT CT CHEST LUNG CANCER SCREENING LOW DOSE W/O CM
1 of 3 series · 10 of 30 positions shown, 13 images · non-contrast
Comparison: Low-dose lung cancer screening CT chest dated
01/06/2019

CLINICAL DATA: 68-year-old male current smoker, with 36 pack-year
history of smoking, for follow-up lung cancer screening

EXAM:
CT CHEST WITHOUT CONTRAST LOW-DOSE FOR LUNG CANCER SCREENING
TECHNIQUE: Multidetector CT imaging of the chest was performed following the
standard protocol without IV contrast.

[ct lung segmentation data · axial · 0.74mm/px · z∈[-316,-316]mm · 10 of 311 frames shown]
[frame 1/311  mediastinal]
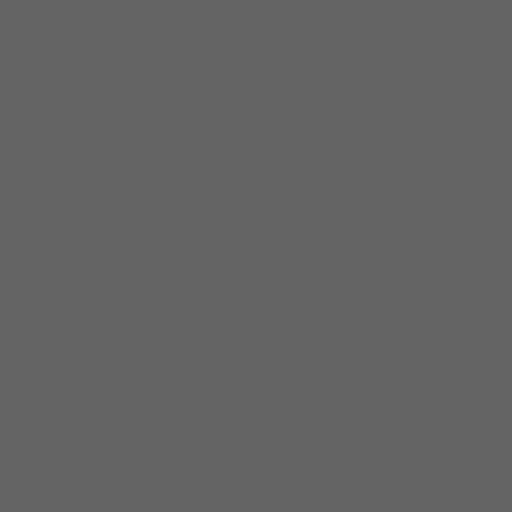
[frame 1/311  lung]
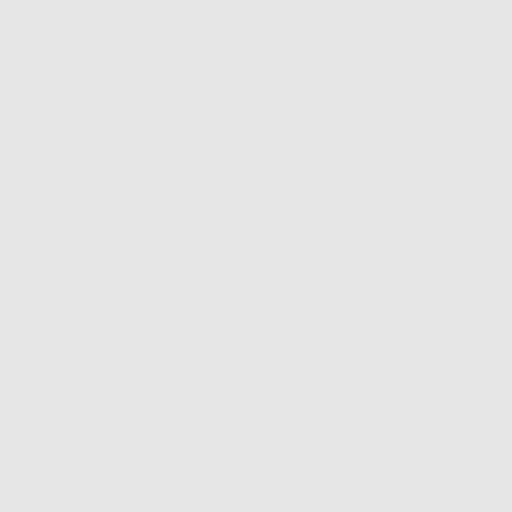
[frame 35/311  lung]
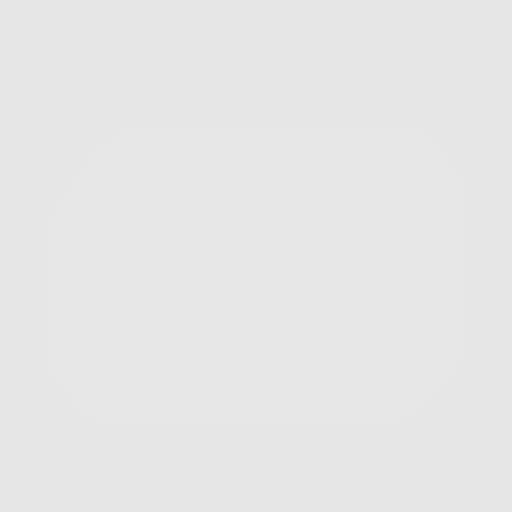
[frame 69/311  lung]
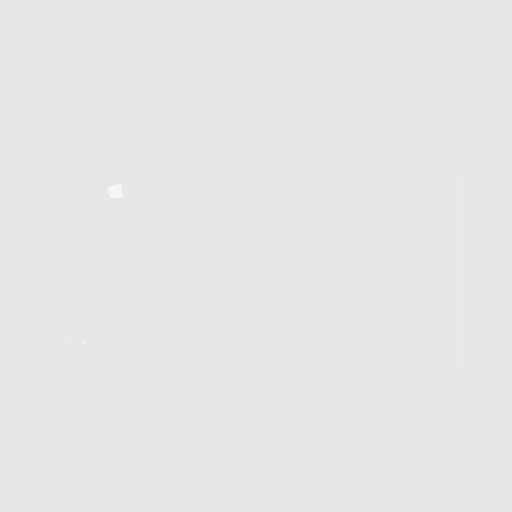
[frame 104/311  lung]
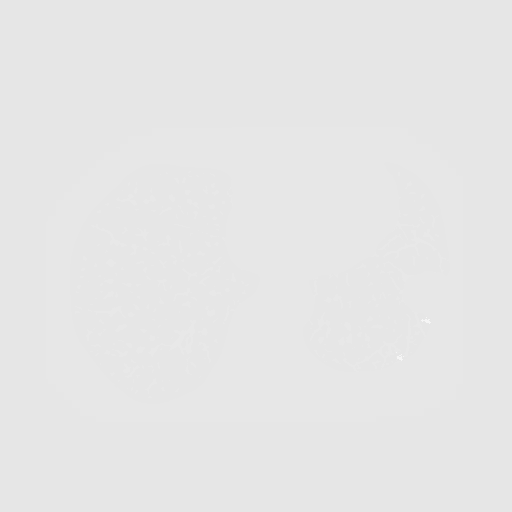
[frame 138/311  mediastinal]
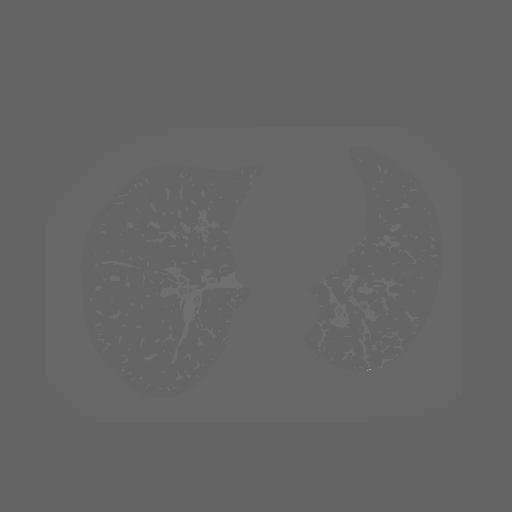
[frame 138/311  lung]
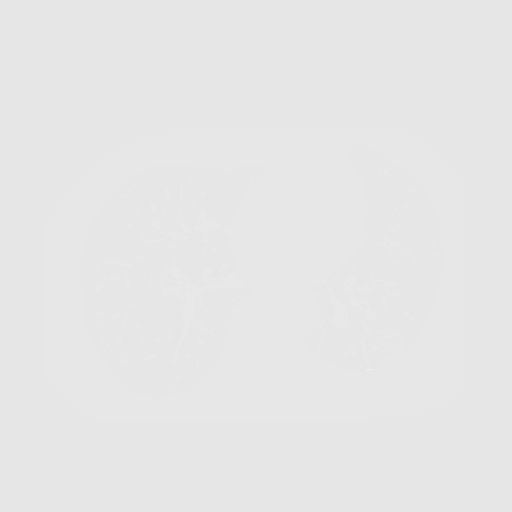
[frame 173/311  lung]
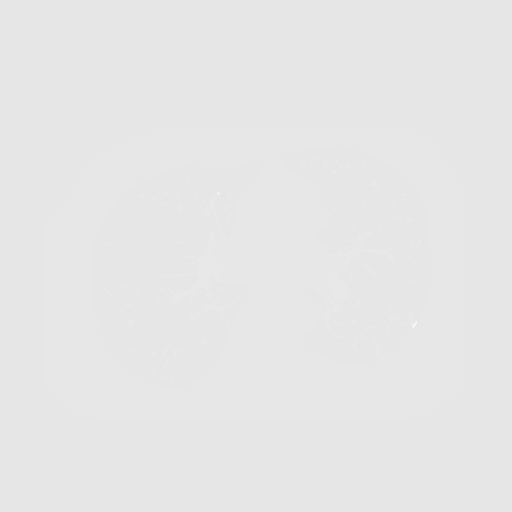
[frame 207/311  lung]
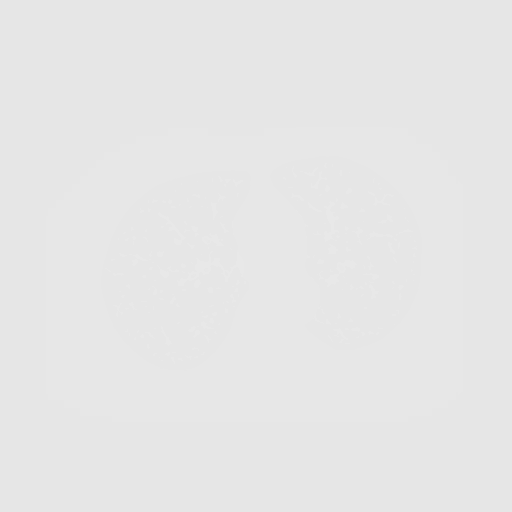
[frame 242/311  lung]
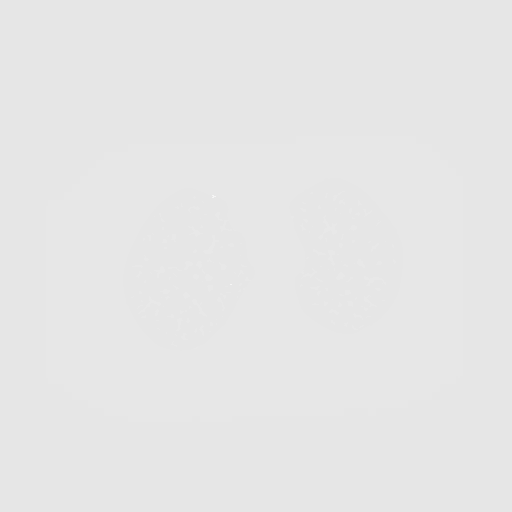
[frame 276/311  mediastinal]
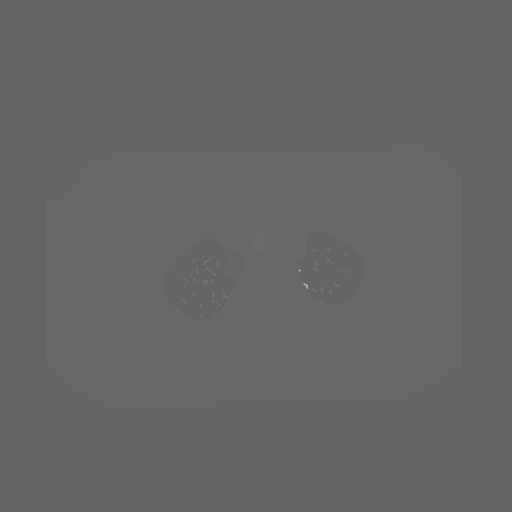
[frame 276/311  lung]
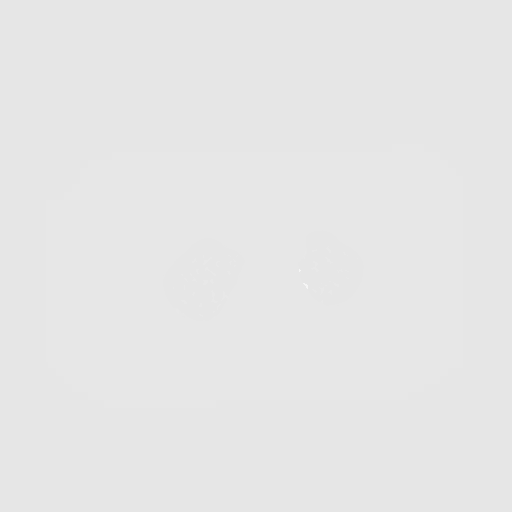
[frame 311/311  lung]
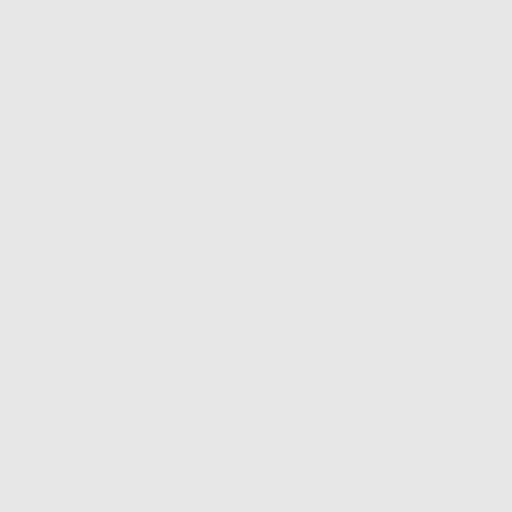

[10 of 30 positions shown; findings below may reference images not displayed]

FINDINGS: Cardiovascular: The heart is normal in size. No pericardial
effusion.

No evidence of thoracic aortic aneurysm. Mild atherosclerotic
calcifications of the aortic arch.

Mild coronary atherosclerosis of the LAD.

Mediastinum/Nodes: Suspicious mediastinal lymphadenopathy.

Visualized thyroid is unremarkable.

Lungs/Pleura: Mild elevation of the left hemidiaphragm with
subpleural scarring.

Mild centrilobular and paraseptal emphysematous changes, upper lung
predominant.

Mild biapical pleural-parenchymal scarring.

No focal consolidation.

2.2 mm subpleural nodule in the left upper lobe.

No new/suspicious pulmonary nodules.

No pleural effusion or pneumothorax.

Upper Abdomen: Visualized upper abdomen is grossly unremarkable.

Musculoskeletal: Visualized osseous structures are within normal
limits.
IMPRESSION: Lung-RADS 2, benign appearance or behavior. Continue annual
screening with low-dose chest CT without contrast in 12 months.

Aortic Atherosclerosis (MYY8S-4VB.B) and Emphysema (MYY8S-QUU.5).

## 2020-08-19 DIAGNOSIS — H40033 Anatomical narrow angle, bilateral: Secondary | ICD-10-CM | POA: Diagnosis not present

## 2020-08-19 DIAGNOSIS — H2513 Age-related nuclear cataract, bilateral: Secondary | ICD-10-CM | POA: Diagnosis not present

## 2020-10-17 ENCOUNTER — Encounter: Payer: Self-pay | Admitting: Internal Medicine

## 2020-10-29 NOTE — Patient Instructions (Addendum)
HEALTH MAINTENANCE RECOMMENDATIONS:  It is recommended that you get at least 30 minutes of aerobic exercise at least 5 days/week (for weight loss, you may need as much as 60-90 minutes). This can be any activity that gets your heart rate up. This can be divided in 10-15 minute intervals if needed, but try and build up your endurance at least once a week.  Weight bearing exercise is also recommended twice weekly.  Eat a healthy diet with lots of vegetables, fruits and fiber.  "Colorful" foods have a lot of vitamins (ie green vegetables, tomatoes, red peppers, etc).  Limit sweet tea, regular sodas and alcoholic beverages, all of which has a lot of calories and sugar.  Up to 2 alcoholic drinks daily may be beneficial for men (unless trying to lose weight, watch sugars).  Drink a lot of water.  Sunscreen of at least SPF 30 should be used on all sun-exposed parts of the skin when outside between the hours of 10 am and 4 pm (not just when at beach or pool, but even with exercise, golf, tennis, and yard work!)  Use a sunscreen that says "broad spectrum" so it covers both UVA and UVB rays, and make sure to reapply every 1-2 hours.  Remember to change the batteries in your smoke detectors when changing your clock times in the spring and fall.  Carbon monoxide detectors are recommended for your home.  Use your seat belt every time you are in a car, and please drive safely and not be distracted with cell phones and texting while driving.   Travis Baldwin , Thank you for taking time to come for your Medicare Wellness Visit. I appreciate your ongoing commitment to your health goals. Please review the following plan we discussed and let me know if I can assist you in the future.    This is a list of the screening recommended for you and due dates:  Health Maintenance  Topic Date Due  . Flu Shot  07/09/2020  . Colon Cancer Screening  07/23/2020  . Tetanus Vaccine  09/30/2028  . COVID-19 Vaccine  Completed    .  Hepatitis C: One time screening is recommended by Center for Disease Control  (CDC) for  adults born from [birth year] through 1965.   Completed  . Pneumonia vaccines  Completed   Follow up as scheduled for colonoscopy in January.  Flu shot and COVID booster were given today.  Please complete and return a copy of your notarized Living Will and [place].  Congratulations on quitting smoking!!!  It is still early in the journey, but it sounds like you got this!!  I AM SO PROUD--keep up the good work!! (now work on your wife....)   Coping with Quitting Smoking  Quitting smoking is a physical and mental challenge. You will face cravings, withdrawal symptoms, and temptation. Before quitting, work with your health care provider to make a plan that can help you cope. Preparation can help you quit and keep you from giving in. How can I cope with cravings? Cravings usually last for 5-10 minutes. If you get through it, the craving will pass. Consider taking the following actions to help you cope with cravings:  Keep your mouth busy: ? Chew sugar-free gum. ? Suck on hard candies or a straw. ? Brush your teeth.  Keep your hands and body busy: ? Immediately change to a different activity when you feel a craving. ? Squeeze or play with a ball. ? Do  an activity or a hobby, like making bead jewelry, practicing needlepoint, or working with wood. ? Mix up your normal routine. ? Take a short exercise break. Go for a quick walk or run up and down stairs. ? Spend time in public places where smoking is not allowed.  Focus on doing something kind or helpful for someone else.  Call a friend or family member to talk during a craving.  Join a support group.  Call a quit line, such as 1-800-QUIT-NOW.  Talk with your health care provider about medicines that might help you cope with cravings and make quitting easier for you. How can I deal with withdrawal symptoms? Your body may  experience negative effects as it tries to get used to not having nicotine in the system. These effects are called withdrawal symptoms. They may include:  Feeling hungrier than normal.  Trouble concentrating.  Irritability.  Trouble sleeping.  Feeling depressed.  Restlessness and agitation.  Craving a cigarette. To manage withdrawal symptoms:  Avoid places, people, and activities that trigger your cravings.  Remember why you want to quit.  Get plenty of sleep.  Avoid coffee and other caffeinated drinks. These may worsen some of your symptoms. How can I handle social situations? Social situations can be difficult when you are quitting smoking, especially in the first few weeks. To manage this, you can:  Avoid parties, bars, and other social situations where people might be smoking.  Avoid alcohol.  Leave right away if you have the urge to smoke.  Explain to your family and friends that you are quitting smoking. Ask for understanding and support.  Plan activities with friends or family where smoking is not an option. What are some ways I can cope with stress? Wanting to smoke may cause stress, and stress can make you want to smoke. Find ways to manage your stress. Relaxation techniques can help. For example:  Breathe slowly and deeply, in through your nose and out through your mouth.  Listen to soothing, relaxing music.  Talk with a family member or friend about your stress.  Light a candle.  Soak in a bath or take a shower.  Think about a peaceful place. What are some ways I can prevent weight gain? Be aware that many people gain weight after they quit smoking. However, not everyone does. To keep from gaining weight, have a plan in place before you quit and stick to the plan after you quit. Your plan should include:  Having healthy snacks. When you have a craving, it may help to: ? Eat plain popcorn, crunchy carrots, celery, or other cut vegetables. ? Chew  sugar-free gum.  Changing how you eat: ? Eat small portion sizes at meals. ? Eat 4-6 small meals throughout the day instead of 1-2 large meals a day. ? Be mindful when you eat. Do not watch television or do other things that might distract you as you eat.  Exercising regularly: ? Make time to exercise each day. If you do not have time for a long workout, do short bouts of exercise for 5-10 minutes several times a day. ? Do some form of strengthening exercise, like weight lifting, and some form of aerobic exercise, like running or swimming.  Drinking plenty of water or other low-calorie or no-calorie drinks. Drink 6-8 glasses of water daily, or as much as instructed by your health care provider. Summary  Quitting smoking is a physical and mental challenge. You will face cravings, withdrawal symptoms, and temptation to  smoke again. Preparation can help you as you go through these challenges.  You can cope with cravings by keeping your mouth busy (such as by chewing gum), keeping your body and hands busy, and making calls to family, friends, or a helpline for people who want to quit smoking.  You can cope with withdrawal symptoms by avoiding places where people smoke, avoiding drinks with caffeine, and getting plenty of rest.  Ask your health care provider about the different ways to prevent weight gain, avoid stress, and handle social situations. This information is not intended to replace advice given to you by your health care provider. Make sure you discuss any questions you have with your health care provider. Document Revised: 11/07/2017 Document Reviewed: 11/22/2016 Elsevier Patient Education  2020 [employer].

## 2020-10-29 NOTE — Progress Notes (Signed)
Chief Complaint  Patient presents with  . Annual Exam    [facility] pt. agreed to get flu shot and covid booster today    Travis Baldwin is a 69 y.o. male who presents for annual physical, Medicare annual wellness visit, and follow-up on chronic medical conditions.  He has no complaints today.  His mother had an accident on lawnmower in 07/2020, and has moved in with him.  They will be selling her house.    Hyperlipidemia follow-up: Patient is reportedly following a low-fat, low cholesterol diet.  Compliant with taking lipitor, along with CoQ10. Lipitor dose was increased to 93m after physical last year, when LDL was 111.  Repeat was at goal, LDL <100. Fasting for lipids today. Lab Results  Component Value Date   CHOL 155 12/22/2019   HDL 48 12/22/2019   LDLCALC 93 12/22/2019   TRIG 73 12/22/2019   CHOLHDL 3.2 12/22/2019   Smoking--He quit smoking 10/31.  An occasional craving, but is doing well.  His wife hasn't yet quit.  He smoked since age 69 close to a pack/day for over 39 years. He had cut back some over the last few years. He can already feel that his breathing has improved.  He had CT screening in 01/2020: FINDINGS: Cardiovascular: The heart is normal in size. No pericardial effusion. No evidence of thoracic aortic aneurysm. Mild atherosclerotic calcifications of the aortic arch. Mild coronary atherosclerosis of the LAD. Mediastinum/Nodes: Suspicious mediastinal lymphadenopathy. Visualized thyroid is unremarkable. Lungs/Pleura: Mild elevation of the left hemidiaphragm with subpleural scarring. Mild centrilobular and paraseptal emphysematous changes, upper lung predominant. Mild biapical pleural-parenchymal scarring. No focal consolidation. 2.2 mm subpleural nodule in the left upper lobe. No new/suspicious pulmonary nodules. No pleural effusion or pneumothorax. Upper Abdomen: Visualized upper abdomen is grossly unremarkable. Musculoskeletal: Visualized osseous structures are  within normal limits.  IMPRESSION: Lung-RADS 2, benign appearance or behavior. Continue annual screening with low-dose chest CT without contrast in 12 months.  Coronary atherosclerosis:  He saw Dr. NMeda Coffeein 01/2020, who reviewed films, noting calcifications in LAD and RCA.  Given his lack of symptoms (and normal EKG at that visit), it was recommended that he continue the aspirin and statin.  No further evaluation needed unless symptoms develop.   Immunization History  Administered Date(s) Administered  . Fluad Quad(high Dose 65+) 10/06/2019  . Influenza Split 09/09/2011, 10/09/2012  . Influenza Whole 09/09/1989, 12/05/1999  . Influenza, High Dose Seasonal PF 09/12/2016, 09/30/2018  . Influenza,inj,Quad PF,6+ Mos 09/29/2013, 12/21/2014, 11/16/2015  . PFIZER SARS-COV-2 Vaccination 03/28/2020, 04/18/2020  . PPD Test 09/10/1995  . Pneumococcal Conjugate-13 09/12/2016  . Pneumococcal Polysaccharide-23 09/10/1995, 09/09/2011, 09/24/2017  . Td 11/23/1997  . Tdap 01/10/2009, 09/30/2018  . Zoster 12/21/2014  . Zoster Recombinat (Shingrix) 09/30/2018, 12/30/2018   Last colonoscopy: 07/2013, due.  Scheduled for 12/2020 with Dr. GCarlean PurlLast PSA:  Lab Results  Component Value Date   PSA1 3.9 10/06/2019   PSA1 3.4 09/30/2018   PSA 2.8 09/24/2017   PSA 3.4 09/12/2016   PSA 2.51 12/21/2014   Ophtho: yearly. Wears one contact for reading and one for distance.  Dentist: wears dentures, went to dentist5 years agofor oral exam.  Exercise: Lifts weight every night. Not walking much, yardwork, some with the dog.  Other doctors caring for patient include: GI: Dr. GCarlean PurlOphtho:Changes up--[facility], will [place] back [place]. Dentist--Dr. JJamie Kato Went4 years ago for oral exam  Neurosurgeon--Dr. Kathyrn Sheriff Urologist--[facility] (hasn't been in many years, was for eval of microscopic hematuria)  Depression  and Fall screens are negative Functional Status survey is unremarkable Mini-cog screen normal  End of Life Discussion: Patient does not havea living will and medical power of attorney. He is unsure if he still has the forms.  PMH, PSH, SH and FH were reviewed and updated.  Outpatient Encounter Medications as of 10/30/2020  Medication Sig Note  . aspirin EC 81 MG tablet Take 81 mg by mouth daily.   Marland Kitchen atorvastatin (LIPITOR) 40 MG tablet TAKE 1 TABLET BY MOUTH EVERY DAY   . co-enzyme Q-10 30 MG capsule Take 30 mg by mouth daily.    . Multiple Vitamins-Minerals (MULTIVITAMIN WITH MINERALS) tablet Take 1 tablet by mouth daily.   Marland Kitchen OVER THE COUNTER MEDICATION Take 1 tablet by mouth 2 (two) times daily. Pro beta prostate   . Aspirin-Salicylamide-Caffeine (BC HEADACHE POWDER PO) Take 1 packet by mouth as needed. Reported on 05/13/2016 (Patient not taking: Reported on 10/30/2020)   . Potassium Chloride (K+ POTASSIUM PO) Take 1 tablet by mouth daily as needed (muscle cramps). (Patient not taking: Reported on 10/30/2020) 10/30/2020: Uses prn, hasn't needed in a month   No facility-administered encounter medications on file as of 10/30/2020.   Allergies  Allergen Reactions  . Codeine Hives  . Vicodin [Hydrocodone-Acetaminophen] Other (See Comments)    Shakiness.    ROS: The patient denies anorexia, fever, weight changes, headaches, vision loss,decreased hearing, ear pain, hoarseness, chest pain, palpitations, dizziness, syncope, dyspnea on exertion, cough, swelling, nausea, vomiting, diarrhea, constipation, abdominal pain, melena, indigestion/heartburn, gross hematuria, incontinence, erectile dysfunction, nocturia, genital lesions, weakness, tremor, suspicious skin lesions, depression, anxiety, abnormal bleeding/bruising, or enlarged lymph nodes. Slightly weakened urinary stream, occasional hesitancy, otherwise no urinary complaints since taking prostate supplements.  Gets up once to void,  usually. Hands fall asleep sometimes when sleeping (clenches fists, better when he changes positions). Still gets occasional L calf cramps at night.  Uses potassium supplement after getting, which he thinks helps. Occurs every few months.   PHYSICAL EXAM:  BP 124/80   Pulse 86   Temp 98.6 F (37 C)   Ht 5' 9.5" (1.765 m)   Wt 169 lb (76.7 kg)   BMI 24.60 kg/m   Wt Readings from Last 3 Encounters:  10/30/20 169 lb (76.7 kg)  01/14/20 170 lb 6.4 oz (77.3 kg)  10/06/19 167 lb 3.2 oz (75.8 kg)    General Appearance:  Alert, cooperative, no distress, appears stated age   Head:  Normocephalic, without obvious abnormality, atraumatic   Eyes:  PERRL, conjunctiva/corneas clear, EOM's intact, fundi benign   Ears:  Normal TM's and external ear canals.  Nose:  Not examined, wearing mask due to COVID-19 pandemic  Throat:  Wearing dentures  Oral exam performed, no lesions (but unable to see beneath dentures)  Neck:  Supple, no lymphadenopathy; thyroid: no enlargement/tenderness/ nodules; no carotid bruit or JVD   Back:  Spine nontender, no curvature, ROM normal, no CVA tenderness   Lungs:  Clear to auscultation bilaterally without wheezes, rales or ronchi; respirations unlabored   Chest Wall:  No tenderness or deformity   Heart:  Regular rate and rhythm, S1 and S2 normal, no murmur, rub or gallop   Breast Exam:  No chest wall tenderness, masses or gynecomastia   Abdomen:  Soft, non-tender, nondistended, normoactive bowel sounds, no masses, no hepatosplenomegaly   Genitalia:  Normal male external genitalia without lesions. Testicles without  masses. No inguinal hernias.   Rectal:  Normal sphincter tone, no masses or tenderness; heme negative stool.Prostate smooth, no nodules, normal size. External hemorrhoids are not inflamed  Extremities:  No clubbing, cyanosis or edema   Pulses:  2+ and symmetric all extremities   Skin:  Skin color, texture, turgor normal,  no rashes or lesions. R cheek--2 moles next to each other.  The larger is oval and measures 5x41m, and smaller one at the upper right portion is 1-1.5x257m  Fairly uniform in color.  Lymph nodes:  Cervical, supraclavicular, and axillary nodes normal   Neurologic:  CNII-XII intact, normal strength, sensation and gait; reflexes 2+ and symmetric throughout          Psych: Normal mood, affect, hygiene and grooming   ASSESSMENT/PLAN:  Annual physical exam - Plan: Comprehensive metabolic panel, CBC with Differential/Platelet, Lipid panel, PSA  Medicare annual wellness visit, initial  Pure hypercholesterolemia - due for recheck, goal LDL<100; prev at goal since atorvastatin increased to 408m - Plan: Lipid panel  Tobacco use disorder - Congratulated on his cessation 3 weeks ago. Wife hasn't quit. Discussed measures to help with success.  Screening for prostate cancer - Plan: PSA  Medication monitoring encounter - Plan: Comprehensive metabolic panel, CBC with Differential/Platelet, Lipid panel  Aortic atherosclerosis (HCC) - cont ASA, statin.   - Plan: Lipid panel  Atherosclerosis of native coronary artery of native heart without angina pectoris - remains asymptomatic. Cont ASA, statin.  Pulmonary emphysema, unspecified emphysema type (HCCHolley noted on CT and abnl spirometry in past.  Asymptomatic. Recently quit smoking and feels breathing has improved  Personal history of colonic adenoma - scheduled for colonoscopy in January  Need for influenza vaccination - Plan: Flu Vaccine QUAD High Dose(Fluad)  Need for COVID-19 vaccine - Plan: Pfizer SARS-COV-2 Vaccine   PSA screening, risks/benefits reviewed, recommended at least 30 minutes of aerobic activity at least 5 days/week, weight-bearing exercise at least 2x/week (gets at work); proper sunscreen use reviewed; healthy diet and alcohol recommendations (less than or equal to 2 drinks/day) reviewed; regular seatbelt use;  changing batteries in smoke detectors. Immunization recommendations discussed--high dose flu shot given. COVID booster given. Colonoscopy recommendations reviewed, scheduled for January. Congratulated on smoking cessation.  Living will and healthcare power of attorney discussed, encouraged return of paperwork. New set given MOST form reviewed, Full Code, Full Care  c-met, lipid, PSA, CBC  F/u 1 year for CPE/AWV  ADDENDUM: glu 107, LDL 112.  Advised 6 month f/u recommended.  If LDL remains >100, consider dose increase.  Check FLP, A!c and fglu at f/u visit.     Medicare Attestation I have personally reviewed: The patient's medical and social history Their use of alcohol, tobacco or illicit drugs Their current medications and supplements The patient's functional ability including ADLs,fall risks, home safety risks, cognitive, and hearing and visual impairment Diet and physical activities Evidence for depression or mood disorders  The patient's weight, height, BMI have been recorded in the chart.  I have made referrals, counseling, and provided education to the patient based on review of the above and I have provided the patient with a written personalized care plan for preventive services.

## 2020-10-30 ENCOUNTER — Ambulatory Visit (INDEPENDENT_AMBULATORY_CARE_PROVIDER_SITE_OTHER): Payer: Medicare Other | Admitting: Family Medicine

## 2020-10-30 ENCOUNTER — Encounter: Payer: Self-pay | Admitting: Family Medicine

## 2020-10-30 ENCOUNTER — Other Ambulatory Visit: Payer: Self-pay

## 2020-10-30 VITALS — BP 124/80 | HR 86 | Temp 98.6°F | Ht 69.5 in | Wt 169.0 lb

## 2020-10-30 DIAGNOSIS — R7301 Impaired fasting glucose: Secondary | ICD-10-CM

## 2020-10-30 DIAGNOSIS — I2510 Atherosclerotic heart disease of native coronary artery without angina pectoris: Secondary | ICD-10-CM | POA: Diagnosis not present

## 2020-10-30 DIAGNOSIS — Z5181 Encounter for therapeutic drug level monitoring: Secondary | ICD-10-CM

## 2020-10-30 DIAGNOSIS — J439 Emphysema, unspecified: Secondary | ICD-10-CM

## 2020-10-30 DIAGNOSIS — Z125 Encounter for screening for malignant neoplasm of prostate: Secondary | ICD-10-CM

## 2020-10-30 DIAGNOSIS — Z23 Encounter for immunization: Secondary | ICD-10-CM | POA: Diagnosis not present

## 2020-10-30 DIAGNOSIS — Z0000 Encounter for general adult medical examination without abnormal findings: Secondary | ICD-10-CM

## 2020-10-30 DIAGNOSIS — I700 Atherosclerosis of aorta: Secondary | ICD-10-CM | POA: Diagnosis not present

## 2020-10-30 DIAGNOSIS — F17200 Nicotine dependence, unspecified, uncomplicated: Secondary | ICD-10-CM

## 2020-10-30 DIAGNOSIS — Z860100 Personal history of colon polyps, unspecified: Secondary | ICD-10-CM

## 2020-10-30 DIAGNOSIS — E7800 Pure hypercholesterolemia, unspecified: Secondary | ICD-10-CM

## 2020-10-30 LAB — LIPID PANEL

## 2020-10-31 LAB — CBC WITH DIFFERENTIAL/PLATELET
Basophils Absolute: 0 10*3/uL (ref 0.0–0.2)
Basos: 0 %
EOS (ABSOLUTE): 0.3 10*3/uL (ref 0.0–0.4)
Eos: 4 %
Hematocrit: 45.4 % (ref 37.5–51.0)
Hemoglobin: 15.3 g/dL (ref 13.0–17.7)
Immature Grans (Abs): 0 10*3/uL (ref 0.0–0.1)
Immature Granulocytes: 0 %
Lymphocytes Absolute: 2.1 10*3/uL (ref 0.7–3.1)
Lymphs: 28 %
MCH: 31.8 pg (ref 26.6–33.0)
MCHC: 33.7 g/dL (ref 31.5–35.7)
MCV: 94 fL (ref 79–97)
Monocytes Absolute: 0.5 10*3/uL (ref 0.1–0.9)
Monocytes: 7 %
Neutrophils Absolute: 4.7 10*3/uL (ref 1.4–7.0)
Neutrophils: 61 %
Platelets: 178 10*3/uL (ref 150–450)
RBC: 4.81 x10E6/uL (ref 4.14–5.80)
RDW: 12.4 % (ref 11.6–15.4)
WBC: 7.7 10*3/uL (ref 3.4–10.8)

## 2020-10-31 LAB — COMPREHENSIVE METABOLIC PANEL
ALT: 36 IU/L (ref 0–44)
AST: 27 IU/L (ref 0–40)
Albumin/Globulin Ratio: 1.8 (ref 1.2–2.2)
Albumin: 4.9 g/dL — ABNORMAL HIGH (ref 3.8–4.8)
Alkaline Phosphatase: 82 IU/L (ref 44–121)
BUN/Creatinine Ratio: 16 (ref 10–24)
BUN: 19 mg/dL (ref 8–27)
Bilirubin Total: 0.5 mg/dL (ref 0.0–1.2)
CO2: 23 mmol/L (ref 20–29)
Calcium: 10.2 mg/dL (ref 8.6–10.2)
Chloride: 103 mmol/L (ref 96–106)
Creatinine, Ser: 1.19 mg/dL (ref 0.76–1.27)
GFR calc Af Amer: 72 mL/min/{1.73_m2} (ref >59)
GFR calc non Af Amer: 62 mL/min/{1.73_m2} (ref >59)
Globulin, Total: 2.7 g/dL (ref 1.5–4.5)
Glucose: 107 mg/dL — ABNORMAL HIGH (ref 65–99)
Potassium: 4.4 mmol/L (ref 3.5–5.2)
Sodium: 143 mmol/L (ref 134–144)
Total Protein: 7.6 g/dL (ref 6.0–8.5)

## 2020-10-31 LAB — LIPID PANEL
Chol/HDL Ratio: 3.2 ratio (ref 0.0–5.0)
Cholesterol, Total: 186 mg/dL (ref 100–199)
HDL: 59 mg/dL (ref >39)
LDL Chol Calc (NIH): 112 mg/dL — ABNORMAL HIGH (ref 0–99)
Triglycerides: 82 mg/dL (ref 0–149)
VLDL Cholesterol Cal: 15 mg/dL (ref 5–40)

## 2020-10-31 LAB — PSA: Prostate Specific Ag, Serum: 3.9 ng/mL (ref 0.0–4.0)

## 2020-10-31 MED ORDER — ATORVASTATIN CALCIUM 40 MG PO TABS: 40.0000 mg | ORAL_TABLET | Freq: Every day | ORAL | 1 refills | Status: DC

## 2020-10-31 NOTE — Progress Notes (Signed)
Pt. Called and aware of lab results and has been scheduled in 6 months for a f/u and fasting blood work.

## 2020-11-27 ENCOUNTER — Ambulatory Visit (AMBULATORY_SURGERY_CENTER): Payer: Self-pay | Admitting: *Deleted

## 2020-11-27 DIAGNOSIS — Z86010 Personal history of colonic polyps: Secondary | ICD-10-CM

## 2020-11-27 NOTE — Progress Notes (Signed)
Began virtual PV but pt was having car trouble and [employer] arrived as we were speaking.  [facility] for tomorrow 11/28/20 in room 50

## 2020-11-28 ENCOUNTER — Ambulatory Visit (AMBULATORY_SURGERY_CENTER): Payer: Self-pay | Admitting: *Deleted

## 2020-11-28 ENCOUNTER — Other Ambulatory Visit: Payer: Self-pay

## 2020-11-28 VITALS — Ht 69.5 in | Wt 170.0 lb

## 2020-11-28 DIAGNOSIS — Z800 Family history of malignant neoplasm of digestive organs: Secondary | ICD-10-CM

## 2020-11-28 DIAGNOSIS — Z86010 Personal history of colonic polyps: Secondary | ICD-10-CM

## 2020-11-28 NOTE — Progress Notes (Signed)
No egg or soy allergy known to patient  No issues with past sedation with any surgeries or procedures No intubation problems in the past  No FH of Malignant Hyperthermia No diet pills per patient No home 02 use per patient  No blood thinners per patient  Pt denies issues with constipation - has a daily soft BM  No A fib or A flutter  EMMI video to pt or via [place] 19 guidelines implemented in PV today with Pt and RN  Pt is fully vaccinated  for Covid    Due to the COVID-19 pandemic we are asking patients to follow certain guidelines.  Pt aware of COVID protocols and [facility] guidelines

## 2020-12-14 ENCOUNTER — Encounter: Payer: Self-pay | Admitting: Family Medicine

## 2020-12-15 ENCOUNTER — Encounter: Payer: Self-pay | Admitting: Internal Medicine

## 2020-12-19 NOTE — Progress Notes (Signed)
Chief Complaint  Patient presents with   Cough    Dry tickling cough that gets worse at night, feels like he has a tough time swallowing. Feels like he has PND. No congestion, no trouble breathing. Hoarseness is getting better. Cough has been there about 2 weeks.     He quit smoking 10/31. He smoked since age 70, close to a pack/day forover39 years. He had cut back some over the last few years. At his physical in November he reported already feeling that his breathing has improved.  Patient presents today with complaint of hoarseness every evening, and running out of breath when talking (more like loses the voice, not feeling short of breath). He denies sore throat or other URI symptoms. 2-3 weeks ago he started with a tickle in his throat, and a dry cough. Cough drops help.  This is worse at night. When he lays down at night, he feels there is some drainage, "feels cold" when he lays down.  He recalls seeing someone in the past for similar cough, told PND.  Found some of the med that was rx'd-- azelastine--it is old, but he restarted it and it helps.  He is using it at bedtime.  Hasn't coughed anything up since he quit smoking. Started astelin the day after he sent the last message in [facility].  Denies sniffling, sneezing, runny nose, no sore throat. Hoarseness and cough has improved since using astelin.   PMH, PSH, SH reviewed  Outpatient Encounter Medications as of 12/20/2020  Medication Sig Note   aspirin EC 81 MG tablet Take 81 mg by mouth daily.    atorvastatin (LIPITOR) 40 MG tablet Take 1 tablet (40 mg total) by mouth daily.    co-enzyme Q-10 30 MG capsule Take 30 mg by mouth daily.     Multiple Vitamins-Minerals (MULTIVITAMIN WITH MINERALS) tablet Take 1 tablet by mouth daily.    OVER THE COUNTER MEDICATION Take 1 tablet by mouth 2 (two) times daily. Pro beta prostate    [DISCONTINUED] azelastine (ASTELIN) 0.1 % nasal spray Place 1 spray into both nostrils at bedtime.  Use in each nostril as directed    Aspirin-Salicylamide-Caffeine (BC HEADACHE POWDER PO) Take 1 packet by mouth as needed. Reported on 05/13/2016 (Patient not taking: No sig reported)    azelastine (ASTELIN) 0.1 % nasal spray Place 1-2 sprays into both nostrils 2 (two) times daily. Use in each nostril as directed    Potassium Chloride (K+ POTASSIUM PO) Take 1 tablet by mouth daily as needed (muscle cramps). (Patient not taking: No sig reported) 10/30/2020: Uses prn, hasn't needed in a month   No facility-administered encounter medications on file as of 12/20/2020.   Allergies  Allergen Reactions   Codeine Hives   Vicodin [Hydrocodone-Acetaminophen] Other (See Comments)    Shakiness.   ROS:  No fever, chills, nausea, vomiting, indigestion, heartburn. No chest pain, shortness of breath. See HPI. He has had some trouble falling asleep, sometimes takes 2 hours. Has a lot on his mind.  Denies nocturia.   PHYSICAL EXAM:  BP 130/80    Pulse 60    Temp (!) 97.3 F (36.3 C) (Tympanic)    Ht 5' 9.5" (1.765 m)    Wt 172 lb (78 kg)    BMI 25.04 kg/m   Wt Readings from Last 3 Encounters:  11/28/20 170 lb (77.1 kg)  10/30/20 169 lb (76.7 kg)  01/14/20 170 lb 6.4 oz (77.3 kg)   Pleasant, well-appearing male in no distress.  His voice is normal. HEENT: conjunctiva and sclera are clear, EOMI. TM's and EAC's normal. Mild to mod edema bilaterally of nasal mucosa, some mucus noted. OP is clear, dentures.  Sinuses nontender Neck: no lymphadenopathy or mass Heart: regular rate and rhythm Abdomen: soft, nontender, no epigastric tenderness Lungs clear bilaterally. Extremities: no edema Psych: normal mood, affect, hygiene and grooming Neuro: alert and oriented, cranial nerves grossly intact. Normal gait  ASSESSMENT/PLAN:  Allergic rhinitis, unspecified seasonality, unspecified trigger - reviewed treatments--astelin seems to be working, so continue that (BID); can add flonase and/or oral  antihistamines if needed - Plan: azelastine (ASTELIN) 0.1 % nasal spray  Former smoker  Cough - Ddx reviewed, including GERD, PND, and others.  Seems to be responding to astelin.   Hoarseness of voice - later in the day, improved since using Astelin. Discussed concerns if hoarseness recurs/is chronic, need for ENT eval given his smoking hx   CT of chest is due next month (routine lung CA screening).   Based on your exam, it appears that your symptoms are coming from postnasal drainage, most likely related to allergies. Since the Astelin spray has helped, we are sending in a refill for you to use this twice daily. If it doesn't help completely, you can either add in loratidine 10mg  once daily (claritin) or start a different nasal spray in addition (Flonase--need to use that one every day). You may also want to raise the head of the bed some (with an extra pillow if your mattress doesn't adjust).  We also discussed the possibility of reflux contributing, but since the spray has helped already, and you have no other symptoms to suggest reflux, we will hold off on having you take any medications such as prilosec.  We discussed that persistent hoarseness could be a sign of something more serious (ie cancer, given the smoking risk factor), so please be sure to let us know if your symptoms do not completely resolve.  We will then refer you to an ENT.

## 2020-12-20 ENCOUNTER — Other Ambulatory Visit: Payer: Self-pay

## 2020-12-20 ENCOUNTER — Encounter: Payer: Self-pay | Admitting: Family Medicine

## 2020-12-20 ENCOUNTER — Ambulatory Visit (INDEPENDENT_AMBULATORY_CARE_PROVIDER_SITE_OTHER): Payer: Medicare Other | Admitting: Family Medicine

## 2020-12-20 VITALS — BP 130/80 | HR 60 | Temp 97.3°F | Ht 69.5 in | Wt 172.0 lb

## 2020-12-20 DIAGNOSIS — J309 Allergic rhinitis, unspecified: Secondary | ICD-10-CM | POA: Diagnosis not present

## 2020-12-20 DIAGNOSIS — R490 Dysphonia: Secondary | ICD-10-CM | POA: Diagnosis not present

## 2020-12-20 DIAGNOSIS — R059 Cough, unspecified: Secondary | ICD-10-CM | POA: Diagnosis not present

## 2020-12-20 DIAGNOSIS — Z87891 Personal history of nicotine dependence: Secondary | ICD-10-CM

## 2020-12-20 MED ORDER — AZELASTINE HCL 0.1 % NA SOLN
1.0000 | Freq: Two times a day (BID) | NASAL | 11 refills | Status: DC
Start: 2020-12-20 — End: 2021-12-27

## 2020-12-20 NOTE — Patient Instructions (Signed)
Based on your exam, it appears that your symptoms are coming from postnasal drainage, most likely related to allergies. Since the Astelin spray has helped, we are sending in a refill for you to use this twice daily. If it doesn't help completely, you can either add in loratidine 10mg  once daily (claritin) or start a different nasal spray in addition (Flonase--need to use that one every day). You may also want to raise the head of the bed some (with an extra pillow if your mattress doesn't adjust).  We also discussed the possibility of reflux contributing, but since the spray has helped already, and you have no other symptoms to suggest reflux, we will hold off on having you take any medications such as prilosec.  We discussed that persistent hoarseness could be a sign of something more serious (ie cancer, given the smoking risk factor), so please be sure to let us know if your symptoms do not completely resolve.  We will then refer you to an ENT.   Insomnia Insomnia is a sleep disorder that makes it difficult to fall asleep or stay asleep. Insomnia can cause fatigue, low energy, difficulty concentrating, mood swings, and poor performance at work or school. There are three different ways to classify insomnia:  Difficulty falling asleep.  Difficulty staying asleep.  Waking up too early in the morning. Any type of insomnia can be long-term (chronic) or short-term (acute). Both are common. Short-term insomnia usually lasts for three months or less. Chronic insomnia occurs at least three times a week for longer than three months. What are the causes? Insomnia may be caused by another condition, situation, or substance, such as:  Anxiety.  Certain medicines.  Gastroesophageal reflux disease (GERD) or other gastrointestinal conditions.  Asthma or other breathing conditions.  Restless legs syndrome, sleep apnea, or other sleep disorders.  Chronic pain.  Menopause.  Stroke.  Abuse of  alcohol, tobacco, or illegal drugs.  Mental health conditions, such as depression.  Caffeine.  Neurological disorders, such as Alzheimer's disease.  An overactive thyroid (hyperthyroidism). Sometimes, the cause of insomnia may not be known. What increases the risk? Risk factors for insomnia include:  Gender. Women are affected more often than men.  Age. Insomnia is more common as you get older.  Stress.  Lack of exercise.  Irregular work schedule or working night shifts.  Traveling between different time zones.  Certain medical and mental health conditions. What are the signs or symptoms? If you have insomnia, the main symptom is having trouble falling asleep or having trouble staying asleep. This may lead to other symptoms, such as:  Feeling fatigued or having low energy.  Feeling nervous about going to sleep.  Not feeling rested in the morning.  Having trouble concentrating.  Feeling irritable, anxious, or depressed. How is this diagnosed? This condition may be diagnosed based on:  Your symptoms and medical history. Your health care provider may ask about: ? Your sleep habits. ? Any medical conditions you have. ? Your mental health.  A physical exam. How is this treated? Treatment for insomnia depends on the cause. Treatment may focus on treating an underlying condition that is causing insomnia. Treatment may also include:  Medicines to help you sleep.  Counseling or therapy.  Lifestyle adjustments to help you sleep better. Follow these instructions at home: Eating and drinking  Limit or avoid alcohol, caffeinated beverages, and cigarettes, especially close to bedtime. These can disrupt your sleep.  Do not eat a large meal or eat spicy  foods right before bedtime. This can lead to digestive discomfort that can make it hard for you to sleep.   Sleep habits  Keep a sleep diary to help you and your health care provider figure out what could be causing your  insomnia. Write down: ? When you sleep. ? When you wake up during the night. ? How well you sleep. ? How rested you feel the next day. ? Any side effects of medicines you are taking. ? What you eat and drink.  Make your bedroom a dark, comfortable place where it is easy to fall asleep. ? Put up shades or blackout curtains to block light from outside. ? Use a white noise machine to block noise. ? Keep the temperature cool.  Limit screen use before bedtime. This includes: ? Watching TV. ? Using your smartphone, tablet, or computer.  Stick to a routine that includes going to bed and waking up at the same times every day and night. This can help you fall asleep faster. Consider making a quiet activity, such as reading, part of your nighttime routine.  Try to avoid taking naps during the day so that you sleep better at night.  Get out of bed if you are still awake after 15 minutes of trying to sleep. Keep the lights down, but try reading or doing a quiet activity. When you feel sleepy, go back to bed.   General instructions  Take over-the-counter and prescription medicines only as told by your health care provider.  Exercise regularly, as told by your health care provider. Avoid exercise starting several hours before bedtime.  Use relaxation techniques to manage stress. Ask your health care provider to suggest some techniques that may work well for you. These may include: ? Breathing exercises. ? Routines to release muscle tension. ? Visualizing peaceful scenes.  Make sure that you drive carefully. Avoid driving if you feel very sleepy.  Keep all follow-up visits as told by your health care provider. This is important. Contact a health care provider if:  You are tired throughout the day.  You have trouble in your daily routine due to sleepiness.  You continue to have sleep problems, or your sleep problems get worse. Get help right away if:  You have serious thoughts about  hurting yourself or someone else. If you ever feel like you may hurt yourself or others, or have thoughts about taking your own life, get help right away. You can go to your nearest emergency department or call:  Your local emergency services (911 in the [place]  A suicide crisis helpline, such as the [place] at 9172027247. This is open 24 hours a day. Summary  Insomnia is a sleep disorder that makes it difficult to fall asleep or stay asleep.  Insomnia can be long-term (chronic) or short-term (acute).  Treatment for insomnia depends on the cause. Treatment may focus on treating an underlying condition that is causing insomnia.  Keep a sleep diary to help you and your health care provider figure out what could be causing your insomnia. This information is not intended to replace advice given to you by your health care provider. Make sure you discuss any questions you have with your health care provider. Document Revised: 10/05/2020 Document Reviewed: 10/05/2020 Elsevier Patient Education  2021 [employer].

## 2020-12-22 ENCOUNTER — Encounter: Payer: Self-pay | Admitting: Internal Medicine

## 2020-12-22 ENCOUNTER — Ambulatory Visit (AMBULATORY_SURGERY_CENTER): Payer: Medicare Other | Admitting: Internal Medicine

## 2020-12-22 ENCOUNTER — Other Ambulatory Visit: Payer: Self-pay

## 2020-12-22 VITALS — BP 113/71 | HR 60 | Temp 97.3°F | Resp 18 | Ht 69.5 in | Wt 172.0 lb

## 2020-12-22 DIAGNOSIS — D122 Benign neoplasm of ascending colon: Secondary | ICD-10-CM

## 2020-12-22 DIAGNOSIS — D125 Benign neoplasm of sigmoid colon: Secondary | ICD-10-CM

## 2020-12-22 DIAGNOSIS — Z86010 Personal history of colonic polyps: Secondary | ICD-10-CM | POA: Diagnosis not present

## 2020-12-22 HISTORY — PX: COLONOSCOPY: SHX174

## 2020-12-22 MED ORDER — SODIUM CHLORIDE 0.9 % IV SOLN: 500.0000 mL | Freq: Once | INTRAVENOUS | Status: DC

## 2020-12-22 NOTE — Patient Instructions (Addendum)
I found and removed 3 tiny polyps.  I feel certain they are benign and not a problem.  I will get them analyzed and let you know the results and plans.  Looks like your brother had a sarcoma which is not really a colon cancer so that does not affect your risk factors.  You also still have diverticulosis and I saw your hemorrhoids.  If the hemorrhoids are bothering you again we can always put some more rubber bands on but that is completely up to you.  I appreciate the opportunity to care for you. Gatha Mayer, MD, [facility]   Handouts on polyps ,diverticulosis, & hemorrhoids given to you today  Await pathology results on polyps removed    YOU HAD AN ENDOSCOPIC PROCEDURE TODAY AT [place]:   Refer to the procedure report that was given to you for any specific questions about what was found during the examination.  If the procedure report does not answer your questions, please call your gastroenterologist to clarify.  If you requested that your care partner not be given the details of your procedure findings, then the procedure report has been included in a sealed envelope for you to review at your convenience later.  YOU SHOULD EXPECT: Some feelings of bloating in the abdomen. Passage of more gas than usual.  Walking can help get rid of the air that was put into your GI tract during the procedure and reduce the bloating. If you had a lower endoscopy (such as a colonoscopy or flexible sigmoidoscopy) you may notice spotting of blood in your stool or on the toilet paper. If you underwent a bowel prep for your procedure, you may not have a normal bowel movement for a few days.  Please Note:  You might notice some irritation and congestion in your nose or some drainage.  This is from the oxygen used during your procedure.  There is no need for concern and it should clear up in a day or so.  SYMPTOMS TO REPORT IMMEDIATELY:   Following lower endoscopy (colonoscopy or flexible  sigmoidoscopy):  Excessive amounts of blood in the stool  Significant tenderness or worsening of abdominal pains  Swelling of the abdomen that is new, acute  Fever of 100F or higher    For urgent or emergent issues, a gastroenterologist can be reached at any hour by calling [phone]. Do not use MyChart messaging for urgent concerns.    DIET:  We do recommend a small meal at first, but then you may proceed to your regular diet.  Drink plenty of fluids but you should avoid alcoholic beverages for 24 hours.  ACTIVITY:  You should plan to take it easy for the rest of today and you should NOT DRIVE or use heavy machinery until tomorrow (because of the sedation medicines used during the test).    FOLLOW UP: Our staff will call the number listed on your records 48-72 hours following your procedure to check on you and address any questions or concerns that you may have regarding the information given to you following your procedure. If we do not reach you, we will leave a message.  We will attempt to reach you two times.  During this call, we will ask if you have developed any symptoms of COVID 19. If you develop any symptoms (ie: fever, flu-like symptoms, shortness of breath, cough etc.) before then, please call [phone].  If you test positive for Covid 19 in the 2 weeks post  procedure, please call and report this information to [place].    If any biopsies were taken you will be contacted by phone or by letter within the next 1-3 weeks.  Please call us at [phone] if you have not heard about the biopsies in 3 weeks.    SIGNATURES/CONFIDENTIALITY: You and/or your care partner have signed paperwork which will be entered into your electronic medical record.  These signatures attest to the fact that that the information above on your After Visit Summary has been reviewed and is understood.  Full responsibility of the confidentiality of this discharge information lies with you and/or your  care-partner.

## 2020-12-22 NOTE — Progress Notes (Signed)
Report to PACU, RN, vss, BBS= Clear.  

## 2020-12-22 NOTE — Progress Notes (Signed)
Pt's states no medical or surgical changes since previsit or office visit.  ° °Vitals CW °

## 2020-12-22 NOTE — Op Note (Signed)
[facility] Patient Name: Travis Baldwin Procedure Date: 12/22/2020 8:50 AM MRN: [id number] Endoscopist: Gatha Mayer , MD Age: 70 Referring MD:  Date of Birth: May 04, 1951 Gender: Male Account #: [id number] Procedure:                Colonoscopy Indications:              High risk colon cancer surveillance: Personal                            history of colonic polyps, Last colonoscopy: 2014 Medicines:                Propofol per Anesthesia, Monitored Anesthesia Care Procedure:                Pre-Anesthesia Assessment:                           - Prior to the procedure, a History and Physical                            was performed, and patient medications and                            allergies were reviewed. The patient's tolerance of                            previous anesthesia was also reviewed. The risks                            and benefits of the procedure and the sedation                            options and risks were discussed with the patient.                            All questions were answered, and informed consent                            was obtained. Prior Anticoagulants: The patient has                            taken no previous anticoagulant or antiplatelet                            agents. ASA Grade Assessment: II - A patient with                            mild systemic disease. After reviewing the risks                            and benefits, the patient was deemed in                            satisfactory condition to undergo the procedure.  After obtaining informed consent, the colonoscope                            was passed under direct vision. Throughout the                            procedure, the patient's blood pressure, pulse, and                            oxygen saturations were monitored continuously. The                            Colonoscope was introduced through the anus and                             advanced to the the cecum, identified by                            appendiceal orifice and ileocecal valve. The                            colonoscopy was performed without difficulty. The                            patient tolerated the procedure well. The quality                            of the bowel preparation was good. The bowel                            preparation used was Miralax via split dose                            instruction. The ileocecal valve, appendiceal                            orifice, and rectum were photographed. Scope In: 9:06:45 AM Scope Out: 9:20:18 AM Scope Withdrawal Time: 0 hours 11 minutes 57 seconds  Total Procedure Duration: 0 hours 13 minutes 33 seconds  Findings:                 The perianal and digital rectal examinations were                            normal. Pertinent negatives include normal prostate                            (size, shape, and consistency).                           Three sessile polyps were found in the sigmoid                            colon and ascending colon. The polyps were  diminutive in size. These polyps were removed with                            a cold snare. Resection and retrieval were                            complete. Verification of patient identification                            for the specimen was done. Estimated blood loss was                            minimal.                           Many small and large-mouthed diverticula were found                            in the colon.                           Internal hemorrhoids were found.                           The exam was otherwise without abnormality on                            direct and retroflexion views. Complications:            No immediate complications. Estimated Blood Loss:     Estimated blood loss was minimal. Impression:               - Three diminutive polyps in the sigmoid colon and                             in the ascending colon, removed with a cold snare.                            Resected and retrieved.                           - Severe diverticulosis.                           - Internal hemorrhoids.                           - The examination was otherwise normal on direct                            and retroflexion views.                           - Personal history of colonic polyps. Recommendation:           - Patient has a contact number available for  emergencies. The signs and symptoms of potential                            delayed complications were discussed with the                            patient. Return to normal activities tomorrow.                            Written discharge instructions were provided to the                            patient.                           - Resume previous diet.                           - Continue present medications.                           - Repeat colonoscopy is recommended for                            surveillance. The colonoscopy date will be                            determined after pathology results from today's                            exam become available for review.                           - OK to reband hemorrhoids if affecting quality of                            life Gatha Mayer, MD 12/22/2020 9:26:48 AM This report has been signed electronically.

## 2020-12-26 ENCOUNTER — Telehealth: Payer: Self-pay

## 2020-12-26 NOTE — Telephone Encounter (Signed)
  Follow up Call-  Call back number 12/22/2020  Post procedure Call Back phone  # [phone]  Permission to leave phone message Yes  Some recent data might be hidden     Patient questions:  Do you have a fever, pain , or abdominal swelling? No. Pain Score  0 *  Have you tolerated food without any problems? Yes.    Have you been able to return to your normal activities? Yes.    Do you have any questions about your discharge instructions: Diet   No. Medications  No. Follow up visit  No.  Do you have questions or concerns about your Care? No.  Actions: * If pain score is 4 or above: No action needed, pain <4. 1. Have you developed a fever since your procedure? no  2.   Have you had an respiratory symptoms (SOB or cough) since your procedure? no  3.   Have you tested positive for COVID 19 since your procedure no  4.   Have you had any family members/close contacts diagnosed with the COVID 19 since your procedure?  no   If yes to any of these questions please route to Joylene John, RN and Joella Prince, RN

## 2020-12-29 ENCOUNTER — Encounter: Payer: Self-pay | Admitting: Internal Medicine

## 2021-01-02 ENCOUNTER — Encounter: Payer: Self-pay | Admitting: Family Medicine

## 2021-01-02 DIAGNOSIS — Z87891 Personal history of nicotine dependence: Secondary | ICD-10-CM

## 2021-01-02 DIAGNOSIS — R490 Dysphonia: Secondary | ICD-10-CM

## 2021-01-29 ENCOUNTER — Ambulatory Visit (INDEPENDENT_AMBULATORY_CARE_PROVIDER_SITE_OTHER): Payer: Medicare Other | Admitting: Otolaryngology

## 2021-01-29 ENCOUNTER — Ambulatory Visit (INDEPENDENT_AMBULATORY_CARE_PROVIDER_SITE_OTHER)
Admission: RE | Admit: 2021-01-29 | Discharge: 2021-01-29 | Disposition: A | Discharge status: Home / Self Care | Payer: Medicare Other | Source: Ambulatory Visit | Attending: Family Medicine | Admitting: Family Medicine

## 2021-01-29 ENCOUNTER — Encounter (INDEPENDENT_AMBULATORY_CARE_PROVIDER_SITE_OTHER): Payer: Self-pay | Admitting: Otolaryngology

## 2021-01-29 ENCOUNTER — Other Ambulatory Visit: Payer: Self-pay

## 2021-01-29 VITALS — Temp 97.2°F

## 2021-01-29 DIAGNOSIS — Z87891 Personal history of nicotine dependence: Secondary | ICD-10-CM | POA: Diagnosis not present

## 2021-01-29 DIAGNOSIS — F17210 Nicotine dependence, cigarettes, uncomplicated: Secondary | ICD-10-CM | POA: Diagnosis not present

## 2021-01-29 DIAGNOSIS — K219 Gastro-esophageal reflux disease without esophagitis: Secondary | ICD-10-CM

## 2021-01-29 DIAGNOSIS — R490 Dysphonia: Secondary | ICD-10-CM | POA: Diagnosis not present

## 2021-01-29 NOTE — Progress Notes (Signed)
HPI: Travis Baldwin is a 70 y.o. male who presents is referred by by his PCP for evaluation of hoarseness.  Patient apparently has had history of cough and fullness in his throat with things getting stuck in his throat.  He has been treated with Prilosec.  He used to smoke and stopped smoking in October of last year.  At that time he was smoking half a pack a day but previously smoked up to a pack a day.  He states that he has had some hoarseness since December that has not totally cleared although it is doing much better over the past month.  He denies sore throat. He is referred here because of persistent hoarseness.  Past Medical History:  Diagnosis Date  . Colon polyp    hyperplastic, adenomatous (2008); Dr. Carlean Purl  . Diverticulosis   . Hyperlipidemia   . Impaired fasting glucose 02/2006  . Internal hemorrhoids Grade 2 prolapsing with bleeding 09/03/2013  . Microscopic hematuria    followed by [facility]; normal cystoscopy 05/2009  . Smoker    Past Surgical History:  Procedure Laterality Date  . CHEST TUBE INSERTION  1996   pockets of infection on outside of lung  . COLONOSCOPY  multiple; 07/2013   Dr. Carlean Purl  . COLONOSCOPY  12/22/2020   Dr Carlean Purl, [facility]  . HEMORRHOID BANDING  09/2013   Dr. Carlean Purl   Social History   Socioeconomic History  . Marital status: Married    Spouse name: Clarene Critchley  . Number of children: 0  . Years of education: Not on file  . Highest education level: Not on file  Occupational History  . Occupation: shipping/receiving for Nurse, adult company    Employer: [employer]  Tobacco Use  . Smoking status: Former Smoker    Packs/day: 0.50    Years: 39.00    Pack years: 19.50    Types: Cigarettes    Quit date: 10/08/2020    Years since quitting: 0.3  . Smokeless tobacco: Never Used  Vaping Use  . Vaping Use: Former  Substance and Sexual Activity  . Alcohol use: Yes    Alcohol/week: 6.0 standard drinks    Types: 6 Shots of liquor per  week    Comment: 1 drink each night (rum and pepsi), 6x/week, 1 shot per drink.  . Drug use: No  . Sexual activity: Yes    Partners: Female  Other Topics Concern  . Not on file  Social History Narrative   Married, lives with wife, 1 dog   Mother moved in with him 07/2020 after an accident   Retired 10/2018      Updated 10/2020   Social Determinants of Health   Financial Resource Strain: Not on file  Food Insecurity: Not on file  Transportation Needs: Not on file  Physical Activity: Not on file  Stress: Not on file  Social Connections: Not on file   Family History  Problem Relation Age of Onset  . Hypertension Mother   . Hyperlipidemia Mother   . Colon polyps Mother   . Cancer Father        bone (jaw)  . Cancer Brother        sarcoma  . Diabetes Maternal Aunt   . Diabetes Maternal Grandfather   . Esophageal cancer Neg Hx   . Rectal cancer Neg Hx   . Stomach cancer Neg Hx    Allergies  Allergen Reactions  . Codeine Hives  . Vicodin [Hydrocodone-Acetaminophen] Other (See Comments)  Shakiness.   Prior to Admission medications   Medication Sig Start Date End Date Taking? Authorizing Provider  aspirin EC 81 MG tablet Take 81 mg by mouth daily.    [provider]  Aspirin-Salicylamide-Caffeine (BC HEADACHE POWDER PO) Take 1 packet by mouth as needed. Reported on 05/13/2016 Patient not taking: No sig reported    [provider]  atorvastatin (LIPITOR) 40 MG tablet Take 1 tablet (40 mg total) by mouth daily. 10/31/20   Rita Ohara, MD  azelastine (ASTELIN) 0.1 % nasal spray Place 1-2 sprays into both nostrils 2 (two) times daily. Use in each nostril as directed 12/20/20   Rita Ohara, MD  co-enzyme Q-10 30 MG capsule Take 30 mg by mouth daily.     [provider]  Multiple Vitamins-Minerals (MULTIVITAMIN WITH MINERALS) tablet Take 1 tablet by mouth daily.    [provider]  OVER THE COUNTER MEDICATION Take 1 tablet by mouth 2 (two) times  daily. Pro beta prostate    [provider]  Potassium Chloride (K+ POTASSIUM PO) Take 1 tablet by mouth daily as needed (muscle cramps). Patient not taking: No sig reported    [provider]     Positive ROS: Otherwise negative  All other systems have been reviewed and were otherwise negative with the exception of those mentioned in the HPI and as above.  Physical Exam: Constitutional: Alert, well-appearing, no acute distress.  He has minimal hoarseness on conversation in the office today. Ears: External ears without lesions or tenderness. Ear canals are clear bilaterally with intact, clear TMs.  Nasal: External nose without lesions. Septum with minimal deformity and mild rhinitis.  Both middle meatus regions were clear with no evidence of mucopurulent discharge.. Oral: Lips and gums without lesions. Tongue and palate mucosa without lesions. Posterior oropharynx clear. Fiberoptic laryngoscopy was performed through the right as well as the left nostril.  The nasopharynx was clear.  The base of tongue vallecula epiglottis were normal.  Vocal cords were clear bilaterally with normal vocal cord mobility.  There was no vocal cord abnormalities noted.  He did have mild edema of the arytenoid mucosa consistent with laryngeal pharyngeal reflux I suspect this is his main contributing problem to his hoarseness. Neck: No palpable adenopathy or masses. Respiratory: Breathing comfortably  Skin: No facial/neck lesions or rash noted.  Laryngoscopy  Date/Time: 01/29/2021 5:48 PM Performed by: Rozetta Nunnery, MD Authorized by: Rozetta Nunnery, MD   Consent:    Consent obtained:  Verbal   Consent given by:  Patient Procedure details:    Indications: direct visualization of the upper aerodigestive tract and hoarseness, dysphagia, or aspiration     Medication:  Afrin   Instrument: flexible fiberoptic laryngoscope     Scope location: bilateral nare   Sinus:    Right  nasopharynx: normal     Left nasopharynx: normal   Mouth:    Oropharynx: normal     Vallecula: normal     Base of tongue: normal     Epiglottis: normal   Throat:    True vocal cords: normal   Comments:     On fiberoptic laryngoscopy the vocal cords were clear bilaterally with normal vocal mobility.  He had mild edema of the arytenoid mucosa and mild erythema consistent with laryngeal pharyngeal reflux but no mucosal abnormalities noted.    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  Normal vocal cord examination on fiberoptic laryngoscopy.  Plan: Recommended omeprazole 40 mg daily before dinner  for the next 4 to 6weeks.  He will follow-up if he has any worsening of his hoarseness.   Radene Journey, MD   CC:

## 2021-01-31 NOTE — Progress Notes (Signed)
Please call patient and let them  know their  low dose Ct was read as a Lung RADS 2: nodules that are benign in appearance and behavior with a very low likelihood of becoming a clinically active cancer due to size or lack of growth. Recommendation per radiology is for a repeat LDCT in 12 months. .Please let them  know we will order and schedule their  annual screening scan for 01/2022 Please let them  know there was notation of CAD on their  scan.  Please remind the patient  that this is a non-gated exam therefore degree or severity of disease  cannot be determined. Please have them  follow up with their PCP regarding potential risk factor modification, dietary therapy or pharmacologic therapy if clinically indicated. Pt.  is  currently on statin therapy. Please place order for annual  screening scan for 01/2022 and fax results to PCP. Thanks so much. 

## 2021-02-01 ENCOUNTER — Other Ambulatory Visit: Payer: Self-pay | Admitting: *Deleted

## 2021-02-01 DIAGNOSIS — F17210 Nicotine dependence, cigarettes, uncomplicated: Secondary | ICD-10-CM

## 2021-02-01 DIAGNOSIS — Z87891 Personal history of nicotine dependence: Secondary | ICD-10-CM

## 2021-03-19 ENCOUNTER — Other Ambulatory Visit: Payer: Self-pay | Admitting: Family Medicine

## 2021-03-19 DIAGNOSIS — J309 Allergic rhinitis, unspecified: Secondary | ICD-10-CM

## 2021-03-19 NOTE — Telephone Encounter (Signed)
Product is backordered. Would you like to change? I called [facility] in [place] (which is near them) and they have it in stock).

## 2021-03-21 NOTE — Telephone Encounter (Signed)
Patient actually does not need right now.

## 2021-04-24 NOTE — Patient Instructions (Signed)
Diabetes Care, 44(Suppl 1), B01-B51. https://doi.org/https://doi.org/10.2337/dc21-[email or web address]">  Prediabetes Prediabetes is when your blood sugar (blood glucose) level is higher than normal but not high enough for you to be diagnosed with type 2 diabetes. Having prediabetes puts you at risk for developing type 2 diabetes (type 2 diabetes mellitus). With certain lifestyle changes, you may be able to prevent or delay the onset of type 2 diabetes. This is important because type 2 diabetes can lead to serious complications, such as:  Heart disease.  Stroke.  Blindness.  Kidney disease.  Depression.  Poor circulation in the feet and legs. In severe cases, this could lead to surgical removal of a leg (amputation). What are the causes? The exact cause of prediabetes is not known. It may result from insulin resistance. Insulin resistance develops when cells in the body do not respond properly to insulin that the body makes. This can cause excess glucose to build up in the blood. High blood glucose (hyperglycemia) can develop. What increases the risk? The following factors may make you more likely to develop this condition:  You have a family member with type 2 diabetes.  You are older than 45 years.  You had a temporary form of diabetes during a pregnancy (gestational diabetes).  You had polycystic ovary syndrome (PCOS).  You are overweight or obese.  You are inactive (sedentary).  You have a history of heart disease, including problems with cholesterol levels, high levels of blood fats, or high blood pressure. What are the signs or symptoms? You may have no symptoms. If you do have symptoms, they may include:  Increased hunger.  Increased thirst.  Increased urination.  Vision changes, such as blurry vision.  Tiredness (fatigue). How is this diagnosed? This condition can be diagnosed with blood tests. Your blood glucose may be checked with one or more of the following  tests:  A fasting blood glucose (FBG) test. You will not be allowed to eat (you will fast) for at least 8 hours before a blood sample is taken.  An A1C blood test (hemoglobin A1C). This test provides information about blood glucose levels over the previous 2?3 months.  An oral glucose tolerance test (OGTT). This test measures your blood glucose at two points in time: ? After fasting. This is your baseline level. ? Two hours after you drink a beverage that contains glucose. You may be diagnosed with prediabetes if:  Your FBG is 100?125 mg/dL (5.6-6.9 mmol/L).  Your A1C level is 5.7?6.4% (39-46 mmol/mol).  Your OGTT result is 140?199 mg/dL (7.8-11 mmol/L). These blood tests may be repeated to confirm your diagnosis.   How is this treated? Treatment may include dietary and lifestyle changes to help lower your blood glucose and prevent type 2 diabetes from developing. In some cases, medicine may be prescribed to help lower the risk of type 2 diabetes. Follow these instructions at home: Nutrition  Follow a healthy meal plan. This includes eating lean proteins, whole grains, legumes, fresh fruits and vegetables, low-fat dairy products, and healthy fats.  Follow instructions from your health care provider about eating or drinking restrictions.  Meet with a dietitian to create a healthy eating plan that is right for you.   Lifestyle  Do moderate-intensity exercise for at least 30 minutes a day on 5 or more days each week, or as told by your health care provider. A mix of activities may be best, such as: ? Brisk walking, swimming, biking, and weight lifting.  Lose weight as told by  your health care provider. Losing 5-7% of your body weight can reverse insulin resistance.  Do not drink alcohol if: ? Your health care provider tells you not to drink. ? You are pregnant, may be pregnant, or are planning to become pregnant.  If you drink alcohol: ? Limit how much you use to:  0-1 drink a  day for women.  0-2 drinks a day for men. ? Be aware of how much alcohol is in your drink. In the [place] one drink equals one 12 oz bottle of beer (355 mL), one 5 oz glass of wine (148 mL), or one 1 oz glass of hard liquor (44 mL). General instructions  Take over-the-counter and prescription medicines only as told by your health care provider. You may be prescribed medicines that help lower the risk of type 2 diabetes.  Do not use any products that contain nicotine or tobacco, such as cigarettes, e-cigarettes, and chewing tobacco. If you need help quitting, ask your health care provider.  Keep all follow-up visits. This is important. Where to find more information  American Diabetes Association: www.diabetes.org  Academy of Nutrition and Dietetics: www.eatright.org  American Heart Association: www.heart.org Contact a health care provider if:  You have any of these symptoms: ? Increased hunger. ? Increased urination. ? Increased thirst. ? Fatigue. ? Vision changes, such as blurry vision. Get help right away if you:  Have shortness of breath.  Feel confused.  Vomit or feel like you may vomit. Summary  Prediabetes is when your blood sugar (blood glucose)level is higher than normal but not high enough for you to be diagnosed with type 2 diabetes.  Having prediabetes puts you at risk for developing type 2 diabetes (type 2 diabetes mellitus).  Make lifestyle changes such as eating a healthy diet and exercising regularly to help prevent diabetes. Lose weight as told by your health care provider. This information is not intended to replace advice given to you by your health care provider. Make sure you discuss any questions you have with your health care provider. Document Revised: 02/24/2020 Document Reviewed: 02/24/2020 Elsevier Patient Education  2021 [place].  Preventing High Cholesterol Cholesterol is a white, waxy substance similar to fat that the human body needs to  help build cells. The liver makes all the cholesterol that a person's body needs. Having high cholesterol (hypercholesterolemia) increases your risk for heart disease and stroke. Extra or excess cholesterol comes from the food that you eat. High cholesterol can often be prevented with diet and lifestyle changes. If you already have high cholesterol, you can control it with diet, lifestyle changes, and medicines. How can high cholesterol affect me? If you have high cholesterol, fatty deposits (plaques) may build up on the walls of your blood vessels. The blood vessels that carry blood away from your heart are called arteries. Plaques make the arteries narrower and stiffer. This in turn can:  Restrict or block blood flow and cause blood clots to form.  Increase your risk for heart attack and stroke. What can increase my risk for high cholesterol? This condition is more likely to develop in people who:  Eat foods that are high in saturated fat or cholesterol. Saturated fat is mostly found in foods that come from animal sources.  Are overweight.  Are not getting enough exercise.  Have a family history of high cholesterol (familial hypercholesterolemia). What actions can I take to prevent this? Nutrition  Eat less saturated fat.  Avoid trans fats (partially hydrogenated oils). These  are often found in margarine and in some baked goods, fried foods, and snacks bought in packages.  Avoid precooked or cured meat, such as bacon, sausages, or meat loaves.  Avoid foods and drinks that have added sugars.  Eat more fruits, vegetables, and whole grains.  Choose healthy sources of protein, such as fish, poultry, lean cuts of red meat, beans, peas, lentils, and nuts.  Choose healthy sources of fat, such as: ? Nuts. ? Vegetable oils, especially olive oil. ? Fish that have healthy fats, such as omega-3 fatty acids. These fish include mackerel or salmon.   Lifestyle  Lose weight if you are  overweight. Maintaining a healthy body mass index (BMI) can help prevent or control high cholesterol. It can also lower your risk for diabetes and high blood pressure. Ask your health care provider to help you with a diet and exercise plan to lose weight safely.  Do not use any products that contain nicotine or tobacco, such as cigarettes, e-cigarettes, and chewing tobacco. If you need help quitting, ask your health care provider. Alcohol use  Do not drink alcohol if: ? Your health care provider tells you not to drink. ? You are pregnant, may be pregnant, or are planning to become pregnant.  If you drink alcohol: ? Limit how much you use to:  0-1 drink a day for women.  0-2 drinks a day for men. ? Be aware of how much alcohol is in your drink. In the [place] one drink equals one 12 oz bottle of beer (355 mL), one 5 oz glass of wine (148 mL), or one 1 oz glass of hard liquor (44 mL). Activity  Get enough exercise. Do exercises as told by your health care provider.  Each week, do at least 150 minutes of exercise that takes a medium level of effort (moderate-intensity exercise). This kind of exercise: ? Makes your heart beat faster while allowing you to still be able to talk. ? Can be done in short sessions several times a day or longer sessions a few times a week. For example, on 5 days each week, you could walk fast or ride your bike 3 times a day for 10 minutes each time.   Medicines  Your health care provider may recommend medicines to help lower cholesterol. This may be a medicine to lower the amount of cholesterol that your liver makes. You may need medicine if: ? Diet and lifestyle changes have not lowered your cholesterol enough. ? You have high cholesterol and other risk factors for heart disease or stroke.  Take over-the-counter and prescription medicines only as told by your health care provider. General information  Manage your risk factors for high cholesterol. Talk with your  health care provider about all your risk factors and how to lower your risk.  Manage other conditions that you have, such as diabetes or high blood pressure (hypertension).  Have blood tests to check your cholesterol levels at regular points in time as told by your health care provider.  Keep all follow-up visits as told by your health care provider. This is important. Where to find more information  American Heart Association: www.heart.org  National Heart, Lung, and Blood Institute: https://wilson-eaton.com/ Summary  High cholesterol increases your risk for heart disease and stroke. By keeping your cholesterol level low, you can reduce your risk for these conditions.  High cholesterol can often be prevented with diet and lifestyle changes.  Work with your health care provider to manage your risk factors, and  have your blood tested regularly. This information is not intended to replace advice given to you by your health care provider. Make sure you discuss any questions you have with your health care provider. Document Revised: 09/07/2019 Document Reviewed: 09/07/2019 Elsevier Patient Education  [place].

## 2021-04-24 NOTE — Progress Notes (Signed)
Chief Complaint  Patient presents with  . Follow-up    6 month follow up, fasting. Would like rx for omeprazole 40mg , is taking two of the 20mg  OTC and he thinks it would be cheaper. Also he thinks his left thumb may have a splinter in it, that the skin has grown over (I didn't see anything, just a bump on th end of thumb) it hurts to the touch.     Patient presents for 6 month follow-up, due for fasting labs.  He noticed a knot on his L thumb.  He has been pressing on it, and now it is sore. He doesn't know if he could have a splinter or anything.  No known injury.  Doesn't hurt if he isn't "messing with it".  He first noticed the knot about 3 months ago. Doesn't have much feeling in it, hard to deal with his contact lenses. He would like this treated/fixed.  Prediabetes: His fasting sugar was noted to be 107 on 12/2019 labs.  He was encouraged to limit his sugars. "I'm eating a little better"--cut out bread, cheese, eating a lot of chicken and vegetables. He is eating a diet similar to his wife--eating more protein, lentil/beans.  He cut back on red meat, and only has 1 rum and coke in the afternoon, no sweets.  His LDL was higher than expected on last check in November.  LDL was 112, which was still >100 despite increasing his statin dose from 20 to 40mg  in 2020.  He was encouraged to limit his cholesterol and recheck in 6 months, to see if medications need to be further adjusted. As reported above, he has made improvements in diet.  He is fasting for recheck today.  Lab Results  Component Value Date   CHOL 186 10/30/2020   HDL 59 10/30/2020   LDLCALC 112 (H) 10/30/2020   TRIG 82 10/30/2020   CHOLHDL 3.2 10/30/2020   He saw Dr. Lucia Gaskins in February for hoarseness.  He had already had some improvement from PPI. He reports today that he had taken 40mg  BID for a bit (on his own), had cut back to 40mg  once daily prior to seeing Dr. Lucia Gaskins (and that it had improved some).  He had normal vocal cord  on fiberoptic laryngoscopy done at visit.  Hoarseness was felt to be secondary to laryngeal pharyngeal reflux. He currently is taking 40mg  omeprazole around 2pm. Denies any heartburn.He continues to have some hoarseness.  It is fine in the morning, but develops with a lot of talking. He is unable to sing anymore at church (the high notes), and is frustrated why this came on all of a sudden.  He is fine when he isn't talking. He wasn't satisfied with ENT visit--didn't get his questions answered, didn't tell him much. He is now getting used to it, not interested in further evaluation at this time. Feels like he gets a knot when he swallows (in throat, not chest), has a hard time drinking water--feels like he needs to belch it back up. He is not interested in swallow study or further evaluation at this time.  PMH, PSH, SH reviewed  Outpatient Encounter Medications as of 04/25/2021  Medication Sig Note  . aspirin EC 81 MG tablet Take 81 mg by mouth daily.   Marland Kitchen atorvastatin (LIPITOR) 40 MG tablet Take 1 tablet (40 mg total) by mouth daily.   Marland Kitchen azelastine (ASTELIN) 0.1 % nasal spray Place 1-2 sprays into both nostrils 2 (two) times daily. Use in  each nostril as directed   . co-enzyme Q-10 30 MG capsule Take 30 mg by mouth daily.    . Multiple Vitamins-Minerals (MULTIVITAMIN WITH MINERALS) tablet Take 1 tablet by mouth daily.   Marland Kitchen omeprazole (PRILOSEC) 40 MG capsule Take 1 capsule (40 mg total) by mouth daily. Take prior to dinner   . OVER THE COUNTER MEDICATION Take 1 tablet by mouth 2 (two) times daily. Pro beta prostate   . [DISCONTINUED] omeprazole (PRILOSEC OTC) 20 MG tablet Take 40 mg by mouth daily.   . Potassium Chloride (K+ POTASSIUM PO) Take 1 tablet by mouth daily as needed (muscle cramps). (Patient not taking: No sig reported) 10/30/2020: Uses prn, hasn't needed in a month  . [DISCONTINUED] Aspirin-Salicylamide-Caffeine (BC HEADACHE POWDER PO) Take 1 packet by mouth as needed. Reported on  05/13/2016 (Patient not taking: No sig reported)    No facility-administered encounter medications on file as of 04/25/2021.   Allergies  Allergen Reactions  . Codeine Hives  . Vicodin [Hydrocodone-Acetaminophen] Other (See Comments)    Shakiness.     PHYSICAL EXAM:  BP 138/80   Pulse 60   Ht 5' 9.5" (1.765 m)   Wt 169 lb 3.2 oz (76.7 kg)   BMI 24.63 kg/m   Wt Readings from Last 3 Encounters:  04/25/21 169 lb 3.2 oz (76.7 kg)  12/22/20 172 lb (78 kg)  12/20/20 172 lb (78 kg)   Well-appearing, pleasant male, in no distress.  Voice sounds normal, no hoarseness HEENT: conjunctiva and sclera are clear, EOMI, wearing mask Neck: no lymphadenopathy, thyromegaly or mass Heart: regular rate and rhythm Lungs: clear bilaterally Abdomen: soft, nontender, no mass Psych: normal mood, affect, hygiene and grooming Neuro: alert, oriented, normal strength, gait Extremities: no edema. Tip of the L thumb--there is a palpable firm area that is <0.5cm in size. Overlying skin is entirely normal and intact. No erythema, nontender. Slight decreased sensation.    ASSESSMENT/PLAN:  Elevated fasting glucose - Diet has improved.  Encouraged low carb, low sugar diet, with regular exercise - Plan: Hemoglobin A1c, Glucose, random  Pure hypercholesterolemia - goal LDL <100.  Pt with coronary and aortic atherosclerosis. adjust statin if LDL remains >100 - Plan: Lipid panel  Need for COVID-19 vaccine - Plan: PFIZER Comirnaty(GRAY TOP)COVID-19 Vaccine  Nodule of finger of left hand - unclear etiology.  no trauma. cyst, FB vs other.  Refer to hand surgeon for eval and treatment - Plan: Ambulatory referral to Hand Surgery  Laryngopharyngeal reflux (LPR) - cont 40mg  qAC-dinner (timing reviewed) omeprazole. If worse, can take BID for a bit. Declined another ENT eval or swallow study - Plan: omeprazole (PRILOSEC) 40 MG capsule

## 2021-04-25 ENCOUNTER — Encounter: Payer: Self-pay | Admitting: Family Medicine

## 2021-04-25 ENCOUNTER — Other Ambulatory Visit: Payer: Self-pay

## 2021-04-25 ENCOUNTER — Ambulatory Visit (INDEPENDENT_AMBULATORY_CARE_PROVIDER_SITE_OTHER): Payer: Medicare Other | Admitting: Family Medicine

## 2021-04-25 VITALS — BP 138/80 | HR 60 | Ht 69.5 in | Wt 169.2 lb

## 2021-04-25 DIAGNOSIS — Z23 Encounter for immunization: Secondary | ICD-10-CM

## 2021-04-25 DIAGNOSIS — K219 Gastro-esophageal reflux disease without esophagitis: Secondary | ICD-10-CM

## 2021-04-25 DIAGNOSIS — R2232 Localized swelling, mass and lump, left upper limb: Secondary | ICD-10-CM

## 2021-04-25 DIAGNOSIS — R7301 Impaired fasting glucose: Secondary | ICD-10-CM | POA: Diagnosis not present

## 2021-04-25 DIAGNOSIS — E7800 Pure hypercholesterolemia, unspecified: Secondary | ICD-10-CM

## 2021-04-25 MED ORDER — OMEPRAZOLE 40 MG PO CPDR: 40.0000 mg | DELAYED_RELEASE_CAPSULE | Freq: Every day | ORAL | 1 refills | Status: DC

## 2021-04-26 LAB — LIPID PANEL
Chol/HDL Ratio: 3.2 ratio (ref 0.0–5.0)
Cholesterol, Total: 155 mg/dL (ref 100–199)
HDL: 49 mg/dL (ref >39)
LDL Chol Calc (NIH): 91 mg/dL (ref 0–99)
Triglycerides: 77 mg/dL (ref 0–149)
VLDL Cholesterol Cal: 15 mg/dL (ref 5–40)

## 2021-04-26 LAB — HEMOGLOBIN A1C
Est. average glucose Bld gHb Est-mCnc: 120 mg/dL
Hgb A1c MFr Bld: 5.8 % — ABNORMAL HIGH (ref 4.8–5.6)

## 2021-04-26 LAB — GLUCOSE, RANDOM: Glucose: 98 mg/dL (ref 65–99)

## 2021-04-26 MED ORDER — ATORVASTATIN CALCIUM 40 MG PO TABS: 40.0000 mg | ORAL_TABLET | Freq: Every day | ORAL | 1 refills | Status: DC

## 2021-05-03 DIAGNOSIS — R2232 Localized swelling, mass and lump, left upper limb: Secondary | ICD-10-CM | POA: Diagnosis not present

## 2021-05-17 DIAGNOSIS — L720 Epidermal cyst: Secondary | ICD-10-CM | POA: Diagnosis not present

## 2021-05-17 DIAGNOSIS — R2232 Localized swelling, mass and lump, left upper limb: Secondary | ICD-10-CM | POA: Diagnosis not present

## 2021-05-17 HISTORY — PX: OTHER SURGICAL HISTORY: SHX169

## 2021-10-20 ENCOUNTER — Other Ambulatory Visit: Payer: Self-pay | Admitting: Family Medicine

## 2021-10-20 DIAGNOSIS — K219 Gastro-esophageal reflux disease without esophagitis: Secondary | ICD-10-CM

## 2021-10-20 DIAGNOSIS — E7800 Pure hypercholesterolemia, unspecified: Secondary | ICD-10-CM

## 2021-11-10 ENCOUNTER — Other Ambulatory Visit: Payer: Self-pay | Admitting: Family Medicine

## 2021-11-10 ENCOUNTER — Encounter: Payer: Self-pay | Admitting: Family Medicine

## 2021-11-10 DIAGNOSIS — E7800 Pure hypercholesterolemia, unspecified: Secondary | ICD-10-CM

## 2021-11-10 DIAGNOSIS — K219 Gastro-esophageal reflux disease without esophagitis: Secondary | ICD-10-CM

## 2021-11-10 NOTE — Progress Notes (Signed)
Chief Complaint  Patient presents with   Medicare Wellness    Fasting AWV/CPE. Still having off and on hoarseness. ENT did not find anything. Using throat drops. No other concerns.     Travis Baldwin is a 70 y.o. male who presents for annual physical, Medicare annual wellness visit, and follow-up on chronic medical conditions.    He had the mass removed from his left thumb in June (was epidermal inclusion cyst).  Prediabetes: His fasting sugar was noted to be 107 on 10/2020 labs. Repeat in 04/2021 was normal at 98.  He had cut back on bread, cheese, eating more chicken and vegetables. He had reported not eating sweets, limiting red meat, and only having 1 rum and regular pepsi in the afternoon about 5x/week.D  He denies any changes to his diet since then.  Hypercholesterolemia:  10/2020 LDL was 112, which was surprising (that it was still >100 despite doubling atorvastatin dose from 20 to 40mg ). He had cut back on red meat and cheese, and recheck in 04/2021 was improved, LDL 91.    Lab Results  Component Value Date   CHOL 155 04/25/2021   HDL 49 04/25/2021   LDLCALC 91 04/25/2021   TRIG 77 04/25/2021   CHOLHDL 3.2 04/25/2021   Hoarseness:  he had evaluation by Dr. Lucia Gaskins in 01/2021, normal vocal cords on fiberoptic laryngoscopy at visit.  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   He currently is taking 40mg  omeprazole daily, around 2pm, when he takes his statin. Denies any heartburn. He continues to have some hoarseness.  It is fine in the morning, but develops with a lot of talking. He is unable to sing anymore at church (the high notes).  He continues to have issues, feeling like he gets a knot, when he swallows water (in the upper part of the throat, not chest). This doesn't occur with soda or other liquids, just water. He sometimes  notices this with eating, but infrequently.  He denies heartburn.  He is not interested in swallow study or further evaluation at this  time.  He reports sometimes having a dry cough that can last for a few weeks before resolving.  He uses cough drops (Luden's, not sugar-free) and Astelin spray when coughing . Seems to help, but temporarily.  He doesn't understand why these problems (the hoarseness and cough) only started in 11/2020, after he had already quit smoking.  Smoking--He quit smoking 10/08/2020.  He no longer has cravings, is doing well.  His wife still smokes.  He smoked since age 6, close to a pack/day for over 39 years. He had cut back some over the last few years. He notices improvement in his breathing, denies any problems.  He had CT screening in 01/2021: IMPRESSION: 1. Lung-RADS 2S, benign appearance or behavior. Continue annual screening with low-dose chest CT without contrast in 12 months. 2. The "S" modifier above refers to potentially clinically significant non lung cancer related findings. Specifically, there is aortic atherosclerosis, in addition to 2 vessel coronary artery disease. Please note that although the presence of coronary artery calcium documents the presence of coronary artery disease, the severity of this disease and any potential stenosis cannot be assessed on this non-gated CT examination. Assessment for potential risk factor modification, dietary therapy or pharmacologic therapy may be warranted, if clinically indicated. 3. Mild diffuse bronchial wall thickening with mild centrilobular and paraseptal emphysema; imaging findings suggestive of underlying COPD.   Coronary atherosclerosis:  He saw Dr.  Nelson in 01/2020, who reviewed films, noting calcifications in LAD and RCA.  Given his lack of symptoms (and normal EKG at that visit), it was recommended that he continue the aspirin and statin.  No further evaluation needed unless symptoms develop.   Immunization History  Administered Date(s) Administered   Fluad Quad(high Dose 65+) 10/06/2019, 10/30/2020   Influenza Split 09/09/2011,  10/09/2012   Influenza Whole 09/09/1989, 12/05/1999   Influenza, High Dose Seasonal PF 09/12/2016, 09/30/2018   Influenza,inj,Quad PF,6+ Mos 09/29/2013, 12/21/2014, 11/16/2015   Influenza-Unspecified 09/05/2021   PFIZER Comirnaty(Gray Top)Covid-19 Tri-Sucrose Vaccine 04/25/2021   PFIZER(Purple Top)SARS-COV-2 Vaccination 03/28/2020, 04/18/2020, 10/30/2020   PPD Test 09/10/1995   Pfizer Covid-19 Vaccine Bivalent Booster 46yrs & up 11/12/2021   Pneumococcal Conjugate-13 09/12/2016   Pneumococcal Polysaccharide-23 09/10/1995, 09/09/2011, 09/24/2017   Td 11/23/1997   Tdap 01/10/2009, 09/30/2018   Zoster Recombinat (Shingrix) 09/30/2018, 12/30/2018   Zoster, Live 12/21/2014   Last colonoscopy: 12/2020 with Dr. Carlean Purl; 1 adenomatous polyp.  7 yr f/u recommended Last PSA:   Lab Results  Component Value Date   PSA1 3.9 10/30/2020   PSA1 3.9 10/06/2019   PSA1 3.4 09/30/2018   PSA 2.8 09/24/2017   PSA 3.4 09/12/2016   PSA 2.51 12/21/2014   Ophtho: yearly.  Wears one contact for reading and one for distance.   Dentist: wears dentures, went to dentist >5 years ago for oral exam.   Exercise: Lifts weight every night, along with some cardio (knee-ups, leg lifts). A lot of yardwork over the summer, and some walking, less since it is cold.  Patient Care Team: Rita Ohara, MD as PCP - General (Family Medicine) GI: Dr. Carlean Purl Ophtho: Changes up--last year went to Dr. Truman Hayward at [facility], didn't like, will go back to [facility] on [facility] in 12/2021 Dentist--Dr. Jamie Kato. Neurosurgeon--Dr. Kathyrn Sheriff  Urologist--[facility] (hasn't been in many years, was for eval of microscopic hematuria)    Depression Screening: [place] Office Visit from 11/12/2021 in [place]  PHQ-2 Total Score 0        Falls screen:  Fall Risk  11/12/2021 04/25/2021 10/30/2020 10/06/2019 09/30/2018  Falls in the past year? 0 0 0 0 No  Number falls in past yr: 0 0 0 - -  Injury with Fall? 0 0  0 - -  Risk for fall due to : No Fall Risks No Fall Risks - - -  Follow up Falls evaluation completed Falls evaluation completed - - -     Functional Status Survey: Is the patient deaf or have difficulty hearing?: No Does the patient have difficulty seeing, even when wearing glasses/contacts?: No Does the patient have difficulty concentrating, remembering, or making decisions?: No Does the patient have difficulty walking or climbing stairs?: No Does the patient have difficulty dressing or bathing?: No Does the patient have difficulty doing errands alone such as visiting a doctor's office or shopping?: No  Mini-Cog Scoring: 5     End of Life Discussion:  Patient did do a living will and a healthcare power of attorney when he did his regular will with an attorney.    PMH, PSH, SH and FH were reviewed and updated.  Outpatient Encounter Medications as of 11/12/2021  Medication Sig Note   aspirin EC 81 MG tablet Take 81 mg by mouth daily.    atorvastatin (LIPITOR) 40 MG tablet TAKE 1 TABLET BY MOUTH EVERY DAY    co-enzyme Q-10 30 MG capsule Take 30 mg by mouth daily.  Multiple Vitamins-Minerals (MULTIVITAMIN WITH MINERALS) tablet Take 1 tablet by mouth daily.    omeprazole (PRILOSEC) 40 MG capsule TAKE 1 CAPSULE (40 MG TOTAL) BY MOUTH DAILY. TAKE PRIOR TO DINNER    OVER THE COUNTER MEDICATION Take 1 tablet by mouth 2 (two) times daily. Pro beta prostate    azelastine (ASTELIN) 0.1 % nasal spray Place 1-2 sprays into both nostrils 2 (two) times daily. Use in each nostril as directed (Patient not taking: Reported on 11/12/2021) 11/12/2021: Uses when he gets a dry cough   Potassium Chloride (K+ POTASSIUM PO) Take 1 tablet by mouth daily as needed (muscle cramps). (Patient not taking: Reported on 11/28/2020) 11/12/2021: Uses prn, last used about 3 weeks ago   No facility-administered encounter medications on file as of 11/12/2021.   Allergies  Allergen Reactions   Codeine Hives   Vicodin  [Hydrocodone-Acetaminophen] Other (See Comments)    Shakiness.    ROS: The patient denies anorexia, fever, weight changes, headaches, vision loss, decreased hearing, ear pain, hoarseness, chest pain, palpitations, dizziness, syncope, dyspnea on exertion, cough, swelling, nausea, vomiting, diarrhea, constipation, abdominal pain, melena, indigestion/heartburn, gross hematuria, incontinence, erectile dysfunction, nocturia, genital lesions, weakness, tremor, suspicious skin lesions, depression, anxiety, abnormal bleeding/bruising, or enlarged lymph nodes.  Occasional blood in the stool which he relates to hemorrhoids.  Denies constipation. Last occurred a few weeks ago. Slightly weakened urinary stream, occasional hesitancy, otherwise no urinary complaints since taking prostate supplements.  Gets up once to void. Hands fall asleep sometimes when sleeping (clenches fists, better when he changes positions).   No problems during the day. Still gets occasional L calf cramps at night.  Uses potassium supplement after getting, which he thinks helps. Occurs every few months. Denies change. +hoarseness and issues with swallowing per HPI. Noticed a redness at the R temple. Denies bleeding, itching, pain.   PHYSICAL EXAM:  BP 120/70   Pulse 72   Ht 5\' 10"  (1.778 m)   Wt 173 lb (78.5 kg)   BMI 24.82 kg/m   Wt Readings from Last 3 Encounters:  11/12/21 173 lb (78.5 kg)  04/25/21 169 lb 3.2 oz (76.7 kg)  12/22/20 172 lb (78 kg)    General Appearance:   Alert, cooperative, no distress, appears stated age    Head:   Normocephalic, without obvious abnormality, atraumatic    Eyes:   PERRL, conjunctiva/corneas clear, EOM's intact, fundi benign    Ears:   Normal TM's and external ear canals.   Nose:   Some crusting in L nare.  Mod edema R>L with some clear mucus.  No erythema. Sinuses nontender  Throat:   No erythema of posterior OP. Has dentures in place, limiting evaluation of mucosa.  Neck:   Supple,  no lymphadenopathy; thyroid: no enlargement/tenderness/ nodules; no carotid bruit or JVD    Back:   Spine nontender, no curvature, ROM normal, no CVA tenderness    Lungs:   Clear to auscultation bilaterally without wheezes, rales or ronchi; respirations unlabored    Chest Wall:   No tenderness or deformity    Heart:   Regular rate and rhythm, S1 and S2 normal, no murmur, rub or gallop    Breast Exam:   No chest wall tenderness, masses or gynecomastia    Abdomen:   Soft, non-tender, nondistended, normoactive bowel sounds, no masses, no hepatosplenomegaly    Genitalia:   Normal male external genitalia without lesions. Testicles without masses. No inguinal hernias.    Rectal:   Normal sphincter tone,  no masses or tenderness; no stool in vault ofr heme testing. Prostate smooth, no nodules, mildly diffusely enlarged. External hemorrhoids are somewhat swollen, not bleeding or tender  Extremities:   No clubbing, cyanosis or edema    Pulses:   2+ and symmetric all extremities    Skin:   Skin color, texture, turgor normal, no rashes or lesions. R cheek--2 moles next to each other, unchanged. Fairly uniform in color. Some flaking of skin R temple. Small area of erythema, with minimal flaking there. Not hyperkeratotic.  Lymph nodes:   Cervical, supraclavicular, inguinal and axillary nodes normal    Neurologic:   Normal strength, sensation and gait; reflexes 2+ and symmetric throughout                         Psych: Normal mood, affect, hygiene and grooming   ASSESSMENT/PLAN:  Annual physical exam  Medicare annual wellness visit, subsequent  Elevated fasting glucose - reviewed diet, limiting sugar, carbs, regular exercise - Plan: Comprehensive metabolic panel, Hemoglobin A1c  Pure hypercholesterolemia - cont atorvastatin and low cholesterol diet - Plan: Lipid panel  Laryngopharyngeal reflux (LPR) - cont PPI--trial of taking it BID x 1 month, then change to qAM  Former smoker - continue with yearly  screening CT (due 01/2022)  Aortic atherosclerosis (HCC) - cont statin  Pulmonary emphysema, unspecified emphysema type ([place]) - noted on yearly CTs for lung cancer screening. Asymptomatic, breathing improved since quitting smoking  Need for COVID-19 vaccine - Plan: Pension scheme manager  Medication monitoring encounter - Plan: Comprehensive metabolic panel, Lipid panel, CBC with Differential/Platelet  Screening for prostate cancer - Plan: PSA  Atherosclerosis of native coronary artery of native heart without angina pectoris - noted on CT. Remains asymptomatic. Cont aspirin and statin  Hoarseness of voice - likely multifactorial--reflux a component, but possibly also PND. Discussed treatment of allergies and how to take PPI  Dysphagia, unspecified type - getting stuck at throat while drinking water, occ with food. Disc dysmotility vs other etiologies. Declines swallow study, EGD  Discussed EGD and/or swallowing study to evaluate his swallowing symptoms, but he declines at this time. 1 month trial of PPI BID, then try cutting back to qAM dosing.  Wants 30d refills going forward (gets $10 discount with each prescription). Lipitor and omeprazole refilled 2 weeks ago x90d. No RF needed today.  Advised to contact us (or request change in quantity with his pharmacy) when refills needed.   PSA screening, risks/benefits reviewed, recommended at least 30 minutes of aerobic activity at least 5 days/week, weight-bearing exercise at least 2x/week; proper sunscreen use reviewed; healthy diet and alcohol recommendations (less than or equal to 2 drinks/day) reviewed; regular seatbelt use; changing batteries in smoke detectors. Immunization recommendations discussed--COVID booster given today. Colonoscopy recommendations reviewed, due again 12/2027.   Living will and healthcare power of attorney was requested to be scanned into chart. MOST form reviewed, Full Code, Full Care    F/u 1  year for CPE/AWV, sooner prn if any worsening symptoms or abnormal labs.   Medicare Attestation I have personally reviewed: The patient's medical and social history Their use of alcohol, tobacco or illicit drugs Their current medications and supplements The patient's functional ability including ADLs,fall risks, home safety risks, cognitive, and hearing and visual impairment Diet and physical activities Evidence for depression or mood disorders  The patient's weight, height, BMI have been recorded in the chart.  I have made referrals, counseling, and provided  education to the patient based on review of the above and I have provided the patient with a written personalized care plan for preventive services.

## 2021-11-10 NOTE — Patient Instructions (Addendum)
  HEALTH MAINTENANCE RECOMMENDATIONS:  It is recommended that you get at least 30 minutes of aerobic exercise at least 5 days/week (for weight loss, you may need as much as 60-90 minutes). This can be any activity that gets your heart rate up. This can be divided in 10-15 minute intervals if needed, but try and build up your endurance at least once a week.  Weight bearing exercise is also recommended twice weekly.  Eat a healthy diet with lots of vegetables, fruits and fiber.  "Colorful" foods have a lot of vitamins (ie green vegetables, tomatoes, red peppers, etc).  Limit sweet tea, regular sodas and alcoholic beverages, all of which has a lot of calories and sugar.  Up to 2 alcoholic drinks daily may be beneficial for men (unless trying to lose weight, watch sugars).  Drink a lot of water.  Sunscreen of at least SPF 30 should be used on all sun-exposed parts of the skin when outside between the hours of 10 am and 4 pm (not just when at beach or pool, but even with exercise, golf, tennis, and yard work!)  Use a sunscreen that says "broad spectrum" so it covers both UVA and UVB rays, and make sure to reapply every 1-2 hours.  Remember to change the batteries in your smoke detectors when changing your clock times in the spring and fall.  Carbon monoxide detectors are recommended for your home.  Use your seat belt every time you are in a car, and please drive safely and not be distracted with cell phones and texting while driving.    Travis Baldwin , Thank you for taking time to come for your Medicare Wellness Visit. I appreciate your ongoing commitment to your health goals. Please review the following plan we discussed and let me know if I can assist you in the future.   This is a list of the screening recommended for you and due dates:  Health Maintenance  Topic Date Due   COVID-19 Vaccine (5 - Booster for Pfizer series) 06/20/2021   Colon Cancer Screening  12/23/2027   Tetanus Vaccine   09/30/2028   Pneumonia Vaccine  Completed   Flu Shot  Completed   Hepatitis C Screening: USPSTF Recommendation to screen - Ages 18-79 yo.  Completed   Zoster (Shingles) Vaccine  Completed   HPV Vaccine  Aged Out   Try using the omeprazole twice daily for a month to see if this helps with the cough and the hoarseness.  If it resolves, you can try and lower the dose back to once daily.  It usually given before breakfast (in the morning), though it reflux is worse at night, some people do better with taking it before bedtime.  Claritin and Flonase might also help with any postnasal drainage from allergies.  These are over-the-counter.  Remember that Flonase needs to be used daily (not just when you feel you need it).  At your convenience, please get [place] a copy of your living will and healthcare power of attorney (to be scanned into your medical chart).

## 2021-11-12 ENCOUNTER — Encounter: Payer: Self-pay | Admitting: Family Medicine

## 2021-11-12 ENCOUNTER — Ambulatory Visit (INDEPENDENT_AMBULATORY_CARE_PROVIDER_SITE_OTHER): Payer: Medicare Other | Admitting: Family Medicine

## 2021-11-12 ENCOUNTER — Other Ambulatory Visit: Payer: Self-pay

## 2021-11-12 VITALS — BP 120/70 | HR 72 | Ht 70.0 in | Wt 173.0 lb

## 2021-11-12 DIAGNOSIS — Z0000 Encounter for general adult medical examination without abnormal findings: Secondary | ICD-10-CM

## 2021-11-12 DIAGNOSIS — Z23 Encounter for immunization: Secondary | ICD-10-CM

## 2021-11-12 DIAGNOSIS — J439 Emphysema, unspecified: Secondary | ICD-10-CM | POA: Diagnosis not present

## 2021-11-12 DIAGNOSIS — R1310 Dysphagia, unspecified: Secondary | ICD-10-CM

## 2021-11-12 DIAGNOSIS — R7301 Impaired fasting glucose: Secondary | ICD-10-CM

## 2021-11-12 DIAGNOSIS — Z125 Encounter for screening for malignant neoplasm of prostate: Secondary | ICD-10-CM | POA: Diagnosis not present

## 2021-11-12 DIAGNOSIS — I2510 Atherosclerotic heart disease of native coronary artery without angina pectoris: Secondary | ICD-10-CM | POA: Diagnosis not present

## 2021-11-12 DIAGNOSIS — K219 Gastro-esophageal reflux disease without esophagitis: Secondary | ICD-10-CM

## 2021-11-12 DIAGNOSIS — Z87891 Personal history of nicotine dependence: Secondary | ICD-10-CM | POA: Diagnosis not present

## 2021-11-12 DIAGNOSIS — E7800 Pure hypercholesterolemia, unspecified: Secondary | ICD-10-CM | POA: Diagnosis not present

## 2021-11-12 DIAGNOSIS — Z5181 Encounter for therapeutic drug level monitoring: Secondary | ICD-10-CM

## 2021-11-12 DIAGNOSIS — R490 Dysphonia: Secondary | ICD-10-CM | POA: Diagnosis not present

## 2021-11-12 DIAGNOSIS — I700 Atherosclerosis of aorta: Secondary | ICD-10-CM

## 2021-11-13 ENCOUNTER — Encounter: Payer: Self-pay | Admitting: Family Medicine

## 2021-11-15 LAB — COMPREHENSIVE METABOLIC PANEL
ALT: 25 IU/L (ref 0–44)
AST: 22 IU/L (ref 0–40)
Albumin/Globulin Ratio: 2.3 — ABNORMAL HIGH (ref 1.2–2.2)
Albumin: 5 g/dL — ABNORMAL HIGH (ref 3.8–4.8)
Alkaline Phosphatase: 83 IU/L (ref 44–121)
BUN/Creatinine Ratio: 15 (ref 10–24)
BUN: 18 mg/dL (ref 8–27)
Bilirubin Total: <0.2 mg/dL (ref 0.0–1.2)
CO2: 20 mmol/L (ref 20–29)
Calcium: 9.9 mg/dL (ref 8.6–10.2)
Chloride: 105 mmol/L (ref 96–106)
Creatinine, Ser: 1.19 mg/dL (ref 0.76–1.27)
Globulin, Total: 2.2 g/dL (ref 1.5–4.5)
Glucose: 104 mg/dL — ABNORMAL HIGH (ref 70–99)
Potassium: 4.4 mmol/L (ref 3.5–5.2)
Sodium: 143 mmol/L (ref 134–144)
Total Protein: 7.2 g/dL (ref 6.0–8.5)
eGFR: 66 mL/min/{1.73_m2} (ref >59)

## 2021-11-15 LAB — CBC WITH DIFFERENTIAL/PLATELET
Basophils Absolute: 0 10*3/uL (ref 0.0–0.2)
Basos: 0 %
EOS (ABSOLUTE): 0.2 10*3/uL (ref 0.0–0.4)
Eos: 3 %
Hematocrit: 43.3 % (ref 37.5–51.0)
Hemoglobin: 14.4 g/dL (ref 13.0–17.7)
Immature Grans (Abs): 0 10*3/uL (ref 0.0–0.1)
Immature Granulocytes: 0 %
Lymphocytes Absolute: 1.4 10*3/uL (ref 0.7–3.1)
Lymphs: 27 %
MCH: 30.7 pg (ref 26.6–33.0)
MCHC: 33.3 g/dL (ref 31.5–35.7)
MCV: 92 fL (ref 79–97)
Monocytes Absolute: 0.4 10*3/uL (ref 0.1–0.9)
Monocytes: 8 %
Neutrophils Absolute: 3.2 10*3/uL (ref 1.4–7.0)
Neutrophils: 62 %
Platelets: 172 10*3/uL (ref 150–450)
RBC: 4.69 x10E6/uL (ref 4.14–5.80)
RDW: 12.1 % (ref 11.6–15.4)
WBC: 5.1 10*3/uL (ref 3.4–10.8)

## 2021-11-15 LAB — LIPID PANEL
Chol/HDL Ratio: 3.3 ratio (ref 0.0–5.0)
Cholesterol, Total: 154 mg/dL (ref 100–199)
HDL: 47 mg/dL (ref >39)
LDL Chol Calc (NIH): 91 mg/dL (ref 0–99)
Triglycerides: 82 mg/dL (ref 0–149)
VLDL Cholesterol Cal: 16 mg/dL (ref 5–40)

## 2021-11-15 LAB — HEMOGLOBIN A1C
Est. average glucose Bld gHb Est-mCnc: 123 mg/dL
Hgb A1c MFr Bld: 5.9 % — ABNORMAL HIGH (ref 4.8–5.6)

## 2021-11-15 LAB — PSA: Prostate Specific Ag, Serum: 2.8 ng/mL (ref 0.0–4.0)

## 2021-12-27 ENCOUNTER — Other Ambulatory Visit: Payer: Self-pay | Admitting: Family Medicine

## 2021-12-27 DIAGNOSIS — J309 Allergic rhinitis, unspecified: Secondary | ICD-10-CM

## 2022-01-15 ENCOUNTER — Other Ambulatory Visit: Payer: Self-pay | Admitting: Family Medicine

## 2022-01-15 DIAGNOSIS — K219 Gastro-esophageal reflux disease without esophagitis: Secondary | ICD-10-CM

## 2022-01-15 DIAGNOSIS — E7800 Pure hypercholesterolemia, unspecified: Secondary | ICD-10-CM

## 2022-03-01 ENCOUNTER — Other Ambulatory Visit: Payer: Self-pay | Admitting: *Deleted

## 2022-03-01 DIAGNOSIS — F17210 Nicotine dependence, cigarettes, uncomplicated: Secondary | ICD-10-CM

## 2022-03-01 DIAGNOSIS — Z87891 Personal history of nicotine dependence: Secondary | ICD-10-CM

## 2022-03-05 ENCOUNTER — Encounter: Payer: Self-pay | Admitting: Family Medicine

## 2022-03-05 DIAGNOSIS — K219 Gastro-esophageal reflux disease without esophagitis: Secondary | ICD-10-CM

## 2022-03-06 MED ORDER — OMEPRAZOLE 40 MG PO CPDR: 40.0000 mg | DELAYED_RELEASE_CAPSULE | Freq: Every day | ORAL | 2 refills | Status: DC

## 2022-03-15 ENCOUNTER — Ambulatory Visit (INDEPENDENT_AMBULATORY_CARE_PROVIDER_SITE_OTHER)
Admission: RE | Admit: 2022-03-15 | Discharge: 2022-03-15 | Disposition: A | Discharge status: Home / Self Care | Payer: Medicare Other | Source: Ambulatory Visit | Attending: Acute Care | Admitting: Acute Care

## 2022-03-15 DIAGNOSIS — F17210 Nicotine dependence, cigarettes, uncomplicated: Secondary | ICD-10-CM

## 2022-03-15 DIAGNOSIS — Z87891 Personal history of nicotine dependence: Secondary | ICD-10-CM | POA: Diagnosis not present

## 2022-03-18 ENCOUNTER — Other Ambulatory Visit: Payer: Self-pay

## 2022-03-18 DIAGNOSIS — F17210 Nicotine dependence, cigarettes, uncomplicated: Secondary | ICD-10-CM

## 2022-03-18 DIAGNOSIS — Z87891 Personal history of nicotine dependence: Secondary | ICD-10-CM

## 2022-03-18 DIAGNOSIS — Z122 Encounter for screening for malignant neoplasm of respiratory organs: Secondary | ICD-10-CM

## 2022-06-26 DIAGNOSIS — R310 Gross hematuria: Secondary | ICD-10-CM | POA: Diagnosis not present

## 2022-07-15 DIAGNOSIS — R310 Gross hematuria: Secondary | ICD-10-CM | POA: Diagnosis not present

## 2022-07-15 DIAGNOSIS — R319 Hematuria, unspecified: Secondary | ICD-10-CM | POA: Diagnosis not present

## 2022-09-17 ENCOUNTER — Encounter: Payer: Self-pay | Admitting: Internal Medicine

## 2022-09-20 DIAGNOSIS — R310 Gross hematuria: Secondary | ICD-10-CM | POA: Diagnosis not present

## 2022-10-10 ENCOUNTER — Other Ambulatory Visit: Payer: Self-pay | Admitting: Family Medicine

## 2022-10-10 DIAGNOSIS — E7800 Pure hypercholesterolemia, unspecified: Secondary | ICD-10-CM

## 2022-11-20 NOTE — Progress Notes (Unsigned)
No chief complaint on file.   Travis Baldwin is a 71 y.o. male who presents for annual physical, Medicare annual wellness visit, and follow-up on chronic medical conditions.    He had episode of gross hematuria in July.  He saw his urologist, had CT scan, and had cystoscopy in October.  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  Prediabetes: His fasting sugar was noted to be 107 in 10/2020, 104 at his physical in 11/2021.   He had cut back on bread, cheese, eating more chicken and vegetables. He had reported not eating sweets, limiting red meat, and only having 1 rum and regular pepsi in the afternoon about 5x/week.   He denies any changes to his diet since then. A1c was 5.9% in 11/2021.  Hypercholesterolemia:  He reports compliance with atorvastatin 17m, and denies side effects. He had cut back on red meat and cheese, and LDL improved from 112 to 91.  He is due for recheck.  He reports diet hasn't changed.  Lab Results  Component Value Date   CHOL 154 11/12/2021   HDL 47 11/12/2021   LDLCALC 91 11/12/2021   TRIG 82 11/12/2021   CHOLHDL 3.3 11/12/2021   Hoarseness:  he had evaluation by Dr. NLucia Gaskinsin 01/2021, normal vocal cords on fiberoptic laryngoscopy at visit.  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   He currently is taking 486momeprazole daily, around 2pm, when he takes his statin. Denies any heartburn. He continues to have some hoarseness.  It is fine in the morning, but develops with a lot of talking. He is unable to sing anymore at church (the high notes).  He continues to have issues, feeling like he gets a knot, when he swallows water (in the upper part of the throat, not chest). This doesn't occur with soda or other liquids, just water. He sometimes  notices this with eating, but infrequently.  He denies heartburn.  He is not interested in swallow study or further evaluation at this time.  He reports  sometimes having a dry cough that can last for a few weeks before resolving.  He uses cough drops (Luden's, not sugar-free) and Astelin spray when coughing . Seems to help, but temporarily.  He doesn't understand why these problems (the hoarseness and cough) only started in 11/2020, after he had already quit smoking.  Former smoker: He quit smoking 10/08/2020, is doing well.  His wife still smokes.  He smoked since age [age]close to a pack/day for over 39 years. He had cut back some over the last few years. He notices improvement in his breathing, denies any problems. He gets yearly lung cancer screening, last was in 03/2022.   Coronary atherosclerosis:  He saw Dr. NeMeda Coffeen 01/2020, who reviewed films, noting calcifications in LAD and RCA.  Given his lack of symptoms (and normal EKG at that visit), it was recommended that he continue the aspirin and statin.  No further evaluation needed unless symptoms develop. He denies chest pain, palpitations, DOE, edema. Aortic atherosclerosis was noted on CT scan done in 07/2022 by urologist.   Immunization History  Administered Date(s) Administered   Fluad Quad(high Dose 65+) 10/06/2019, 10/30/2020   Influenza Split 09/09/2011, 10/09/2012   Influenza Whole 09/09/1989, 12/05/1999   Influenza, High Dose Seasonal PF 09/12/2016, 09/30/2018, 07/18/2022   Influenza,inj,Quad PF,6+ Mos 09/29/2013, 12/21/2014, 11/16/2015   Influenza-Unspecified 09/05/2021   PFIZER Comirnaty(Gray Top)Covid-19 Tri-Sucrose Vaccine 04/25/2021   PFIZER(Purple  Top)SARS-COV-2 Vaccination 03/28/2020, 04/18/2020, 10/30/2020   PPD Test 09/10/1995   Pfizer Covid-19 Vaccine Bivalent Booster 81yr & up 11/12/2021   Pneumococcal Conjugate-13 09/12/2016   Pneumococcal Polysaccharide-23 09/10/1995, 09/09/2011, 09/24/2017   Td 11/23/1997   Tdap 01/10/2009, 09/30/2018   Zoster Recombinat (Shingrix) 09/30/2018, 12/30/2018   Zoster, Live 12/21/2014   Last colonoscopy: 12/2020 with Dr. GCarlean Purl 1  adenomatous polyp.  7 yr f/u recommended Last PSA:   Lab Results  Component Value Date   PSA1 2.8 11/12/2021   PSA1 3.9 10/30/2020   PSA1 3.9 10/06/2019   PSA 2.8 09/24/2017   PSA 3.4 09/12/2016   PSA 2.51 12/21/2014   Ophtho: yearly.  Wears one contact for reading and one for distance.   Dentist: wears dentures, went to dentist >5 years ago for oral exam.   Exercise: Lifts weight every night, along with some cardio (knee-ups, leg lifts). A lot of yardwork over the summer, and some walking, less since it is cold.  Patient Care Team: KRita Ohara MD as PCP - General (Family Medicine) Dr. JJamie Kato DDS as Consulting Physician (Dentistry) GI: Dr. GCarlean PurlOphtho: Changes up--[place], [facility] Neuro[facility]geon--Dr. NKathyrn Sheriff Urologist--Dr. Dahlstedt    Depression Screening: [place] PHQ-2 Total Score 0        Falls screen:     11/12/2021    8:35 AM 04/25/2021   11:06 AM 10/30/2020    8:19 AM 10/06/2019    8:32 AM 09/30/2018    8:40 AM  [place] the past year? 0 0 0 0 No  Number falls in past yr: 0 0 0    Injury with Fall? 0 0 0    Risk for fall due to : No Fall Risks No Fall Risks     Follow up Falls evaluation completed Falls evaluation completed        Functional Status Survey:          End of Life Discussion:  Patient did do a living will and a healthcare power of attorney when he did his regular will with an attorney. No copies in chart    PMH, PSH, SH and FH were reviewed and updated.     ROS: The patient denies anorexia, fever, weight changes, headaches, vision loss, decreased hearing, ear pain, hoarseness, chest pain, palpitations, dizziness, syncope, dyspnea on exertion, cough, swelling, nausea, vomiting, diarrhea, constipation, abdominal pain, melena, indigestion/heartburn, incontinence,  erectile dysfunction, nocturia, genital lesions, weakness, tremor, suspicious skin lesions, depression, anxiety, abnormal bleeding/bruising, or enlarged lymph nodes.   Occasional blood in the stool which he relates to hemorrhoids.  Denies constipation. Last occurred a few weeks ago. Slightly weakened urinary stream, occasional hesitancy, otherwise no urinary complaints since taking prostate supplements.  Gets up once to void. Hands fall asleep sometimes when sleeping (clenches fists, better when he changes positions).   No problems during the day. Still gets occasional L calf cramps at night.  Uses potassium supplement after getting, which he thinks helps. Occurs every few months. Denies change. +hoarseness and issues with swallowing per HPI. Gross hematuria in July, s/p eval by urologist.   PHYSICAL EXAM:  There were no vitals taken for this visit.  Wt Readings from Last 3 Encounters:  11/12/21 173 lb (78.5 kg)  04/25/21 169 lb 3.2 oz (76.7 kg)  12/22/20 172 lb (78  kg)    General Appearance:   Alert, cooperative, no distress, appears stated age    Head:   Normocephalic, without obvious abnormality, atraumatic    Eyes:   PERRL, conjunctiva/corneas clear, EOM's intact, fundi benign    Ears:   Normal TM's and external ear canals.   Nose:   No drainage or sinus tenderness  Throat:   No erythema of posterior OP. Has dentures in place, limiting evaluation of mucosa.  Neck:   Supple, no lymphadenopathy; thyroid: no enlargement/tenderness/ nodules; no carotid bruit or JVD    Back:   Spine nontender, no curvature, ROM normal, no CVA tenderness    Lungs:   Clear to auscultation bilaterally without wheezes, rales or ronchi; respirations unlabored    Chest Wall:   No tenderness or deformity    Heart:   Regular rate and rhythm, S1 and S2 normal, no murmur, rub or gallop    Breast Exam:   No chest wall tenderness, masses or gynecomastia    Abdomen:   Soft, non-tender, nondistended, normoactive  bowel sounds, no masses, no hepatosplenomegaly    Genitalia:   Normal male external genitalia without lesions. Testicles without masses. No inguinal hernias.    Rectal:   Normal sphincter tone, no masses or tenderness; no stool in vault ofr heme testing. Prostate smooth, no nodules, mildly diffusely enlarged. External hemorrhoids are somewhat swollen, not bleeding or tender  Extremities:   No clubbing, cyanosis or edema    Pulses:   2+ and symmetric all extremities    Skin:   Skin color, texture, turgor normal, no rashes or lesions. R cheek--2 moles next to each other, unchanged. Fairly uniform in color. Some flaking of skin R temple. Small area of erythema, with minimal flaking there. Not hyperkeratotic.  Lymph nodes:   Cervical, supraclavicular, inguinal nodes normal    Neurologic:   Normal strength, sensation and gait; reflexes 2+ and symmetric throughout                         Psych: Normal mood, affect, hygiene and grooming  UPDATE *** ?defer GU exam to urologist? Done August Update skin of R cheek, R temple  ASSESSMENT/PLAN:  COVID booster RSV Consider prevnar 20 next year  A1c--offer separate or with labs (I'm fine to do with labs) PSA, lipids, cbc, c-met  Did urologist do full GU exam? If so, can skip  Last year--Discussed EGD and/or swallowing study to evaluate his swallowing symptoms, but he declines at this time. 1 month trial of PPI BID, then try cutting back to qAM dosing.  Last year--Wants 30d refills going forward (gets $10 discount with each prescription).?? Should need omeprazole RF.  Lipitor RF in November x 90d]     PSA screening, risks/benefits reviewed, recommended at least 30 minutes of aerobic activity at least 5 days/week, weight-bearing exercise at least 2x/week; proper sunscreen use reviewed; healthy diet and alcohol recommendations (less than or equal to 2 drinks/day) reviewed; regular seatbelt use; changing batteries in smoke detectors. Immunization  recommendations discussed--continue yearly flu shots.   COVID booster RSV vaccine discussed/recommended. Consider ZOXWRUE-45 next year. Colonoscopy recommendations reviewed, due again 12/2027.   Living will and healthcare power of attorney was requested to be scanned into chart. MOST form reviewed, Full Code, Full Care    F/u 1 year for CPE/AWV, sooner prn if any worsening symptoms or abnormal labs.   Medicare Attestation I have personally reviewed: The patient's medical and social history Their  use of alcohol, tobacco or illicit drugs Their current medications and supplements The patient's functional ability including ADLs,fall risks, home safety risks, cognitive, and hearing and visual impairment Diet and physical activities Evidence for depression or mood disorders  The patient's weight, height, BMI have been recorded in the chart.  I have made referrals, counseling, and provided education to the patient based on review of the above and I have provided the patient with a written personalized care plan for preventive services.

## 2022-11-20 NOTE — Patient Instructions (Incomplete)
  HEALTH MAINTENANCE RECOMMENDATIONS:  It is recommended that you get at least 30 minutes of aerobic exercise at least 5 days/week (for weight loss, you may need as much as 60-90 minutes). This can be any activity that gets your heart rate up. This can be divided in 10-15 minute intervals if needed, but try and build up your endurance at least once a week.  Weight bearing exercise is also recommended twice weekly.  Eat a healthy diet with lots of vegetables, fruits and fiber.  "Colorful" foods have a lot of vitamins (ie green vegetables, tomatoes, red peppers, etc).  Limit sweet tea, regular sodas and alcoholic beverages, all of which has a lot of calories and sugar.  Up to 2 alcoholic drinks daily may be beneficial for men (unless trying to lose weight, watch sugars).  Drink a lot of water.  Sunscreen of at least SPF 30 should be used on all sun-exposed parts of the skin when outside between the hours of 10 am and 4 pm (not just when at beach or pool, but even with exercise, golf, tennis, and yard work!)  Use a sunscreen that says "broad spectrum" so it covers both UVA and UVB rays, and make sure to reapply every 1-2 hours.  Remember to change the batteries in your smoke detectors when changing your clock times in the spring and fall.  Carbon monoxide detectors are recommended for your home.  Use your seat belt every time you are in a car, and please drive safely and not be distracted with cell phones and texting while driving.    Travis Baldwin , Thank you for taking time to come for your Medicare Wellness Visit. I appreciate your ongoing commitment to your health goals. Please review the following plan we discussed and let me know if I can assist you in the future.   This is a list of the screening recommended for you and due dates:  Health Maintenance  Topic Date Due   Medicare Annual Wellness Visit  10/05/2020   COVID-19 Vaccine (6 - 2023-24 season) 08/09/2022   Colon Cancer Screening   12/23/2027   DTaP/Tdap/Td vaccine (4 - Td or Tdap) 09/30/2028   Pneumonia Vaccine  Completed   Flu Shot  Completed   Hepatitis C Screening: USPSTF Recommendation to screen - Ages 18-79 yo.  Completed   Zoster (Shingles) Vaccine  Completed   HPV Vaccine  Aged Out   RSV vaccine is recommended. You need to get this from the pharmacy, and wait 2 weeks from today's vaccine.  Don't wait longer--this is a winter illness, so the sooner the better. Consider getting a COVID booster as well.  Please bring [place] copies of your Living Will and [place] so that it can be scanned into your medical chart.  Consider using an antihistamine (claritin, zyrtec or allegra) when your cough starts, along with Flonase. The pills will work faster than the [facility].  You can also try some Mucinex/robitussin (guaifenesin) to help when coughing.

## 2022-11-21 ENCOUNTER — Encounter: Payer: Self-pay | Admitting: Family Medicine

## 2022-11-21 ENCOUNTER — Ambulatory Visit (INDEPENDENT_AMBULATORY_CARE_PROVIDER_SITE_OTHER): Payer: Medicare Other | Admitting: Family Medicine

## 2022-11-21 VITALS — BP 120/70 | HR 60 | Ht 69.0 in | Wt 175.4 lb

## 2022-11-21 DIAGNOSIS — Z5181 Encounter for therapeutic drug level monitoring: Secondary | ICD-10-CM

## 2022-11-21 DIAGNOSIS — I2510 Atherosclerotic heart disease of native coronary artery without angina pectoris: Secondary | ICD-10-CM

## 2022-11-21 DIAGNOSIS — J439 Emphysema, unspecified: Secondary | ICD-10-CM | POA: Diagnosis not present

## 2022-11-21 DIAGNOSIS — R490 Dysphonia: Secondary | ICD-10-CM | POA: Diagnosis not present

## 2022-11-21 DIAGNOSIS — Z87891 Personal history of nicotine dependence: Secondary | ICD-10-CM

## 2022-11-21 DIAGNOSIS — Z125 Encounter for screening for malignant neoplasm of prostate: Secondary | ICD-10-CM

## 2022-11-21 DIAGNOSIS — Z0000 Encounter for general adult medical examination without abnormal findings: Secondary | ICD-10-CM

## 2022-11-21 DIAGNOSIS — K219 Gastro-esophageal reflux disease without esophagitis: Secondary | ICD-10-CM

## 2022-11-21 DIAGNOSIS — E7800 Pure hypercholesterolemia, unspecified: Secondary | ICD-10-CM

## 2022-11-21 DIAGNOSIS — I700 Atherosclerosis of aorta: Secondary | ICD-10-CM

## 2022-11-21 DIAGNOSIS — R310 Gross hematuria: Secondary | ICD-10-CM | POA: Diagnosis not present

## 2022-11-21 DIAGNOSIS — R7303 Prediabetes: Secondary | ICD-10-CM

## 2022-11-21 LAB — POCT URINALYSIS DIP (PROADVANTAGE DEVICE)
Bilirubin, UA: NEGATIVE
Blood, UA: NEGATIVE
Glucose, UA: NEGATIVE mg/dL
Ketones, POC UA: NEGATIVE mg/dL
Leukocytes, UA: NEGATIVE
Nitrite, UA: NEGATIVE
Protein Ur, POC: NEGATIVE mg/dL
Specific Gravity, Urine: 1.02
Urobilinogen, Ur: 0.2
pH, UA: 6 (ref 5.0–8.0)

## 2022-11-21 MED ORDER — OMEPRAZOLE 40 MG PO CPDR: 40.0000 mg | DELAYED_RELEASE_CAPSULE | Freq: Every day | ORAL | 3 refills | Status: DC

## 2022-11-22 LAB — COMPREHENSIVE METABOLIC PANEL
ALT: 25 IU/L (ref 0–44)
AST: 19 IU/L (ref 0–40)
Albumin/Globulin Ratio: 2.1 (ref 1.2–2.2)
Albumin: 4.8 g/dL (ref 3.8–4.8)
Alkaline Phosphatase: 66 IU/L (ref 44–121)
BUN/Creatinine Ratio: 13 (ref 10–24)
BUN: 15 mg/dL (ref 8–27)
Bilirubin Total: 0.7 mg/dL (ref 0.0–1.2)
CO2: 24 mmol/L (ref 20–29)
Calcium: 9.6 mg/dL (ref 8.6–10.2)
Chloride: 103 mmol/L (ref 96–106)
Creatinine, Ser: 1.2 mg/dL (ref 0.76–1.27)
Globulin, Total: 2.3 g/dL (ref 1.5–4.5)
Glucose: 108 mg/dL — ABNORMAL HIGH (ref 70–99)
Potassium: 4.3 mmol/L (ref 3.5–5.2)
Sodium: 140 mmol/L (ref 134–144)
Total Protein: 7.1 g/dL (ref 6.0–8.5)
eGFR: 65 mL/min/{1.73_m2} (ref >59)

## 2022-11-22 LAB — CBC WITH DIFFERENTIAL/PLATELET
Basophils Absolute: 0 10*3/uL (ref 0.0–0.2)
Basos: 0 %
EOS (ABSOLUTE): 0.1 10*3/uL (ref 0.0–0.4)
Eos: 3 %
Hematocrit: 42.1 % (ref 37.5–51.0)
Hemoglobin: 13.8 g/dL (ref 13.0–17.7)
Immature Grans (Abs): 0 10*3/uL (ref 0.0–0.1)
Immature Granulocytes: 0 %
Lymphocytes Absolute: 1.2 10*3/uL (ref 0.7–3.1)
Lymphs: 26 %
MCH: 31.6 pg (ref 26.6–33.0)
MCHC: 32.8 g/dL (ref 31.5–35.7)
MCV: 96 fL (ref 79–97)
Monocytes Absolute: 0.4 10*3/uL (ref 0.1–0.9)
Monocytes: 8 %
Neutrophils Absolute: 2.9 10*3/uL (ref 1.4–7.0)
Neutrophils: 63 %
Platelets: 153 10*3/uL (ref 150–450)
RBC: 4.37 x10E6/uL (ref 4.14–5.80)
RDW: 12.5 % (ref 11.6–15.4)
WBC: 4.6 10*3/uL (ref 3.4–10.8)

## 2022-11-22 LAB — LIPID PANEL
Chol/HDL Ratio: 3.2 ratio (ref 0.0–5.0)
Cholesterol, Total: 153 mg/dL (ref 100–199)
HDL: 48 mg/dL (ref >39)
LDL Chol Calc (NIH): 93 mg/dL (ref 0–99)
Triglycerides: 59 mg/dL (ref 0–149)
VLDL Cholesterol Cal: 12 mg/dL (ref 5–40)

## 2022-11-22 LAB — PSA: Prostate Specific Ag, Serum: 2.5 ng/mL (ref 0.0–4.0)

## 2022-11-22 LAB — HEMOGLOBIN A1C
Est. average glucose Bld gHb Est-mCnc: 126 mg/dL
Hgb A1c MFr Bld: 6 % — ABNORMAL HIGH (ref 4.8–5.6)

## 2023-01-08 ENCOUNTER — Other Ambulatory Visit: Payer: Self-pay | Admitting: Family Medicine

## 2023-01-08 DIAGNOSIS — E7800 Pure hypercholesterolemia, unspecified: Secondary | ICD-10-CM

## 2023-03-12 ENCOUNTER — Encounter: Payer: Self-pay | Admitting: Acute Care

## 2023-03-18 ENCOUNTER — Ambulatory Visit
Admission: RE | Admit: 2023-03-18 | Discharge: 2023-03-18 | Disposition: A | Discharge status: Home / Self Care | Payer: Medicare Other | Source: Ambulatory Visit | Attending: Family Medicine | Admitting: Family Medicine

## 2023-03-18 DIAGNOSIS — Z87891 Personal history of nicotine dependence: Secondary | ICD-10-CM | POA: Diagnosis not present

## 2023-03-18 DIAGNOSIS — Z122 Encounter for screening for malignant neoplasm of respiratory organs: Secondary | ICD-10-CM

## 2023-03-19 ENCOUNTER — Other Ambulatory Visit: Payer: Self-pay | Admitting: Acute Care

## 2023-03-19 DIAGNOSIS — Z122 Encounter for screening for malignant neoplasm of respiratory organs: Secondary | ICD-10-CM

## 2023-03-19 DIAGNOSIS — Z87891 Personal history of nicotine dependence: Secondary | ICD-10-CM

## 2023-12-01 NOTE — Patient Instructions (Incomplete)
  HEALTH MAINTENANCE RECOMMENDATIONS:  It is recommended that you get at least 30 minutes of aerobic exercise at least 5 days/week (for weight loss, you may need as much as 60-90 minutes). This can be any activity that gets your heart rate up. This can be divided in 10-15 minute intervals if needed, but try and build up your endurance at least once a week.  Weight bearing exercise is also recommended twice weekly.  Eat a healthy diet with lots of vegetables, fruits and fiber.  "Colorful" foods have a lot of vitamins (ie green vegetables, tomatoes, red peppers, etc).  Limit sweet tea, regular sodas and alcoholic beverages, all of which has a lot of calories and sugar.  Up to 2 alcoholic drinks daily may be beneficial for men (unless trying to lose weight, watch sugars).  Drink a lot of water.  Sunscreen of at least SPF 30 should be used on all sun-exposed parts of the skin when outside between the hours of 10 am and 4 pm (not just when at beach or pool, but even with exercise, golf, tennis, and yard work!)  Use a sunscreen that says "broad spectrum" so it covers both UVA and UVB rays, and make sure to reapply every 1-2 hours.  Remember to change the batteries in your smoke detectors when changing your clock times in the spring and fall.  Carbon monoxide detectors are recommended for your home.  Use your seat belt every time you are in a car, and please drive safely and not be distracted with cell phones and texting while driving.   Mr. Yax , Thank you for taking time to come for your Medicare Wellness Visit. I appreciate your ongoing commitment to your health goals. Please review the following plan we discussed and let me know if I can assist you in the future.   This is a list of the screening recommended for you and due dates:  Health Maintenance  Topic Date Due   COVID-19 Vaccine (6 - 2024-25 season) 12/17/2023*   Screening for Lung Cancer  03/17/2024   Medicare Annual Wellness Visit   12/03/2024   Colon Cancer Screening  12/23/2027   DTaP/Tdap/Td vaccine (4 - Td or Tdap) 09/30/2028   Pneumonia Vaccine  Completed   Flu Shot  Completed   Hepatitis C Screening  Completed   Zoster (Shingles) Vaccine  Completed   HPV Vaccine  Aged Out  *Topic was postponed. The date shown is not the original due date.   I recommend getting the RSV vaccine from the pharmacy.  Please bring [place] copies of your Living Will and Healthcare Power of Attorney so that it can be scanned into your medical chart.

## 2023-12-01 NOTE — Progress Notes (Signed)
Chief Complaint  Patient presents with   Annual Exam    Fasting cpe, blood in stool for the last 4-5 weeks- thought it was from pulling on his mother. Woke up yesterday wit hoarseness, raw throat, and chest congestion. Decline covid vaccine, will get RSV shot at pharmacy.  Due for pneumonoccal shot today but wants to know should he wait since he is not feeling good today. Saw urology within the last year.    Travis Baldwin is a 72 y.o. male who presents for annual physical, Medicare annual wellness visit, and follow-up on chronic medical conditions.    He is complaining of hoarseness, sore throat, congestion since yesterday.  Denies sinus pain or significant nasal congestion. Just some congestion which started last night.  He didn't sleep well last night due to the throat rawness. He took mucinex DM (6 hour version) once yesterday and once today.  He also has noted some blood in his stool. He had been having to do a lot of lifting of his mother (who was unable to walk--now getting home PT and improved).  This started after this straining. He has h/o hemorrhoids.  Has had this in the past, but usually resolves quickly.  This has been ongoing, but somewhat intermittent (not with every bowel movement). Denies any pain with BM's, just BRB on the stool (sometimes outside, sometimes mixed within). Denies constipation, stools are soft--normal.  Today his stool was loose for the first time (associated with timing of acute illness, per above).  Prediabetes: His fasting sugar was noted to be 107 in 10/2020, 104 at his physical in 11/2021, and 108 in 11/2022. He continues to try and limit bread and sweets.  Had some carrot cake over the holidays, and some fruitcake. Has 1 rum and diet pepsi in the afternoon about 5-6x/week (switched last year from regular pepsi to diet).   Denies significant dietary changes in the last year. A1c was 6.0% in 11/2022.   Hypercholesterolemia:  He reports compliance with  atorvastatin 40mg , and denies side effects. He continues to try and limit red meat and cheese, and LDL improved. No longer eats cheese.  Has a steak every 2-3 weeks, pork 2-3 times/week. Eats a lot of chicken. He is due for recheck.  He reports diet hasn't changed.  Lab Results  Component Value Date   CHOL 153 11/21/2022   HDL 48 11/21/2022   LDLCALC 93 11/21/2022   TRIG 59 11/21/2022   CHOLHDL 3.2 11/21/2022    Hoarseness since he quit smoking.  Cough comes and goes since then. He had evaluation by Dr. Ezzard Standing in 01/2021, normal vocal cords on fiberoptic laryngoscopy at visit.  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  He continues to take 40mg  omeprazole daily, denies any heartburn, but continues to have some hoarseness (worse with recent illness).  Throat feels more dry when he starts talking more. He is unable to sing anymore at church (the high notes).  "My throat dries out." He occasionally feels like he gets a knot, when he swallows water (in the upper part of the throat, not chest). This is intermittent with water, and doesn't occur with soda or other liquids. At one point he had issues with food periodically, but that resolved. The water issue is infrequent.  He reports sometimes having a dry cough that can last for a few weeks before resolving.  The cough resolves on its own after a couple of weeks, but notes that using Flonase prn seems  to help.  Last this occurred was 2-3 months ago.  Former smoker: He quit smoking 10/08/2020, is doing well.  His wife still smokes.  He smoked since age 60, close to a pack/day for over 39 years.  He notices improvement in his breathing, denies any problems, other than the intermittent cough and persistent hoarseness that started after he quit smoking. He gets yearly lung cancer screening, last was in 03/2023:  1. Lung-RADS 1S, negative. Continue annual screening with low-dose chest CT without contrast in 12 months. 2. The "S"  modifier above refers to potentially clinically significant non lung cancer related findings. Specifically, there is aortic atherosclerosis, in addition to left main and 2 vessel coronary artery disease. Please note that although the presence of coronary artery calcium documents the presence of coronary artery disease, the severity of this disease and any potential stenosis cannot be assessed on this non-gated CT examination. Assessment for potential risk factor modification, dietary therapy or pharmacologic therapy may be warranted, if clinically indicated. 3. Mild diffuse bronchial wall thickening with mild centrilobular and paraseptal emphysema; imaging findings suggestive of underlying COPD.   Coronary and aortic atherosclerosis:  He saw Dr. Delton See in 01/2020, who reviewed films, noting calcifications in LAD and RCA.  Given his lack of symptoms (and normal EKG at that visit), it was recommended that he continue the aspirin and statin.  No further evaluation needed unless symptoms develop. He denies chest pain, palpitations, DOE, edema.   Immunization History  Administered Date(s) Administered   Fluad Quad(high Dose 65+) 10/06/2019, 10/30/2020   Influenza Split 09/09/2011, 10/09/2012   Influenza Whole 09/09/1989, 12/05/1999   Influenza, High Dose Seasonal PF 09/12/2016, 09/30/2018, 07/18/2022, 08/28/2023   Influenza,inj,Quad PF,6+ Mos 09/29/2013, 12/21/2014, 11/16/2015   Influenza-Unspecified 09/05/2021   PFIZER Comirnaty(Gray Top)Covid-19 Tri-Sucrose Vaccine 04/25/2021   PFIZER(Purple Top)SARS-COV-2 Vaccination 03/28/2020, 04/18/2020, 10/30/2020   PPD Test 09/10/1995   Pfizer Covid-19 Vaccine Bivalent Booster 29yrs & up 11/12/2021   Pneumococcal Conjugate-13 09/12/2016   Pneumococcal Polysaccharide-23 09/10/1995, 09/09/2011, 09/24/2017   Td 11/23/1997   Tdap 01/10/2009, 09/30/2018   Zoster Recombinant(Shingrix) 09/30/2018, 12/30/2018   Zoster, Live 12/21/2014   Last colonoscopy:  12/2020 with Dr. Leone Payor; 1 adenomatous polyp.  7 yr f/u recommended Last PSA:   Lab Results  Component Value Date   PSA1 2.5 11/21/2022   PSA1 2.8 11/12/2021   PSA1 3.9 10/30/2020   PSA 2.8 09/24/2017   PSA 3.4 09/12/2016   PSA 2.51 12/21/2014   Ophtho: yearly.  Wears one contact for reading and one for distance.   Dentist: wears dentures, went to dentist >5 years ago for oral exam.   Exercise: Lifts weight every night, 3-4 x/week.  Not getting out of the house, due to caring for his mom, less cardio.  Patient Care Team: Joselyn Arrow, MD as PCP - General (Family Medicine) Dr. Hulan Saas, DDS as Consulting Physician (Dentistry) GI: Dr. Leone Payor Ophtho: Dr. Nedra Hai Neurosurgeon--Dr. Conchita Paris  Urologist--Dr. Dahlstedt    Depression Screening: Flowsheet Row Office Visit from 12/04/2023 in [facility]  PHQ-2 Total Score 0        Falls screen:     12/04/2023    8:33 AM 11/21/2022    8:35 AM 11/12/2021    8:35 AM 04/25/2021   11:06 AM 10/30/2020    8:19 AM  Fall Risk   Falls in the past year? 0 0 0 0 0  Number falls in past yr: 0 0 0 0 0  Injury with Fall? 0 0  0 0 0  Risk for fall due to : History of fall(s) No Fall Risks No Fall Risks No Fall Risks   Follow up Falls evaluation completed Falls evaluation completed Falls evaluation completed Falls evaluation completed      Functional Status Survey: Is the patient deaf or have difficulty hearing?: Yes ("a little maybe") Does the patient have difficulty seeing, even when wearing glasses/contacts?: No Does the patient have difficulty concentrating, remembering, or making decisions?: No Does the patient have difficulty walking or climbing stairs?: No Does the patient have difficulty dressing or bathing?: No Does the patient have difficulty doing errands alone such as visiting a doctor's office or shopping?: No     Normal 6-CIT cognitive screen  End of Life Discussion:  Patient had thought he had a living will  and a healthcare power of attorney done when he did his regular will with an attorney. Today he states he looked at it, didn't see this; forms given today.    PMH, PSH, SH and FH were reviewed and updated.  Outpatient Encounter Medications as of 12/04/2023  Medication Sig Note   aspirin EC 81 MG tablet Take 81 mg by mouth daily.    atorvastatin (LIPITOR) 40 MG tablet TAKE 1 TABLET BY MOUTH EVERY DAY    co-enzyme Q-10 30 MG capsule Take 30 mg by mouth daily.     Cranberry 500 MG TABS Take 1 tablet by mouth daily.    Multiple Vitamins-Minerals (MULTIVITAMIN WITH MINERALS) tablet Take 1 tablet by mouth daily.    omeprazole (PRILOSEC) 40 MG capsule Take 1 capsule (40 mg total) by mouth daily. Take prior to dinner    OVER THE COUNTER MEDICATION Take 1 tablet by mouth 2 (two) times daily. Pro beta prostate    Potassium Chloride (K+ POTASSIUM PO) Take 1 tablet by mouth daily as needed (muscle cramps). (Patient not taking: Reported on 12/04/2023) 12/04/2023: Took a week ago   [DISCONTINUED] Azelastine HCl 137 MCG/SPRAY SOLN PLACE 1-2 SPRAYS INTO BOTH NOSTRILS 2 (TWO) TIMES DAILY. USE IN EACH NOSTRIL AS DIRECTED (Patient not taking: Reported on 11/21/2022) 11/21/2022: Not using   [DISCONTINUED] fluticasone (FLONASE) 50 MCG/ACT nasal spray Place 2 sprays into both nostrils daily. (Patient not taking: Reported on 11/21/2022) 11/21/2022: Uses prn with cough   No facility-administered encounter medications on file as of 12/04/2023.   Also taking Mucinex DM 6 hour per HPI  Allergies  Allergen Reactions   Codeine Hives   Vicodin [Hydrocodone-Acetaminophen] Other (See Comments)    Shakiness.     ROS: The patient denies anorexia, fever, headaches, vision loss, decreased hearing, ear pain, chest pain, palpitations, dizziness, syncope, dyspnea on exertion, cough, swelling, nausea, vomiting, diarrhea, constipation, abdominal pain, melena, indigestion/heartburn, incontinence, erectile dysfunction, nocturia,  genital lesions, weakness, tremor, suspicious skin lesions, depression, anxiety, abnormal bleeding/bruising, or enlarged lymph nodes.  Hemorrhoidal bleeding per HPI. Slightly weakened urinary stream, occasional hesitancy, otherwise no urinary complaints since taking prostate supplements.  Gets up once or twice to void. Denies dysuria, urinary frequency, hematuria, incontinence Still gets occasional L calf cramps at night.  Uses potassium supplement after getting, which he thinks helps. Last occurred a week ago. Used to be better when he was drinking lemon water, no longer drinks this. +hoarseness persists, unchanged; swallowing difficulty has improved (rare with water only). URI symptoms and sore throat per HPI.     PHYSICAL EXAM:  BP 122/70   Pulse 95   Temp 98.7 F (37.1 C)   Ht 5' 9.5" (  1.765 m)   Wt 174 lb 6.4 oz (79.1 kg)   SpO2 95%   BMI 25.39 kg/m   Wt Readings from Last 3 Encounters:  12/04/23 174 lb 6.4 oz (79.1 kg)  11/21/22 175 lb 6.4 oz (79.6 kg)  11/12/21 173 lb (78.5 kg)    General Appearance:   Alert, cooperative, no distress, appears stated age. Some throat-clearing during visit, no cough.  Head:   Normocephalic, without obvious abnormality, atraumatic    Eyes:   PERRL, conjunctiva/corneas clear, EOM's intact, fundi benign    Ears:   Normal TM's and external ear canals.   Nose:   Some clear mucus noted bilaterally. No sinus tenderness  Throat:   No erythema of posterior OP. Has dentures in place, limiting evaluation of mucosa.  Neck:   Supple, no lymphadenopathy; thyroid: no enlargement/tenderness/ nodules; no carotid bruit or JVD    Back:   Spine nontender, no curvature, ROM normal, no CVA tenderness    Lungs:   Clear to auscultation bilaterally without wheezes, rales or ronchi; respirations unlabored    Chest Wall:   No tenderness or deformity    Heart:   Regular rate and rhythm, S1 and S2 normal, no murmur, rub or gallop. Initially noted to be somewhat  bradycardic with ectopy (extra beats); after coughing, pulse was back up to 90 and regular   Breast Exam:   No chest wall tenderness, masses or gynecomastia    Abdomen:   Soft, non-tender, nondistended, normoactive bowel sounds, no masses, no hepatosplenomegaly    Genitalia:   No lesions. Testicles/scrotum normal. No inguinal hernias  Rectal:   +nontender external hemorrhoids.  Normal sphincter tone.  Prostate is mildly enlarged, smooth, no nodules.  Heme negative brown stool  Extremities:   No clubbing, cyanosis or edema    Pulses:   2+ and symmetric all extremities    Skin:   Skin color, texture, turgor normal, no rashes or lesions.  Dry patch at R knee.  Lymph nodes:   Cervical, supraclavicular nodes normal    Neurologic:   Normal strength, sensation and gait; reflexes 2+ and symmetric throughout                   Psych: Normal mood, affect, hygiene and grooming   ASSESSMENT/PLAN:  Annual physical exam  Medicare annual wellness visit, subsequent  Pure hypercholesterolemia - reviewed low cholesterol diet, cont statin - Plan: atorvastatin (LIPITOR) 40 MG tablet, Lipid panel  Pulmonary emphysema, unspecified emphysema type (HCC) - noted on CT, asymptomatic  Laryngopharyngeal reflux (LPR) - cont 40mg  omeprazole. - Plan: omeprazole (PRILOSEC) 40 MG capsule  Prediabetes - reviewed proper diet, encouraged daily exercise - Plan: CMP14+EGFR, Hemoglobin A1c  Aortic atherosclerosis (HCC) - cont statin  Atherosclerosis of native coronary artery of native heart without angina pectoris - cont statin, aspirin. Remains asymptomatic  External hemorrhoid, bleeding - Anusol HC prn.  Stool was heme negative and colonoscopy is UTD.  Bleeding is less frequent/improving, so no further eval needed now - Plan: hydrocortisone (ANUSOL-HC) 2.5 % rectal cream  Viral URI - supportive measures reviewed.  Recommended COVID testing in 1-2d (too early today).  Screening for prostate cancer - Plan:  PSA  Medication monitoring encounter - Plan: Lipid panel, CBC with Differential/Platelet, CMP14+EGFR  PSA screening, risks/benefits reviewed, recommended at least 30 minutes of aerobic activity at least 5 days/week, weight-bearing exercise at least 2x/week; proper sunscreen use reviewed; healthy diet and alcohol recommendations (less than or equal to 2 drinks/day) reviewed;  regular seatbelt use; changing batteries in smoke detectors. Immunization recommendations discussed--continue yearly flu shots.  COVID booster recommended, declined. Prevnar-20 recommended--declined today due to illness.  Recommended to get with or after RSV. RSV vaccine discussed/recommended (to get from pharmacy).  Colonoscopy recommendations reviewed, due again 12/2027.   Living will and healthcare power of attorney --discussed and forms given. MOST form reviewed, Full Code, Full Care    F/u 1 year for CPE/AWV, sooner prn if any worsening symptoms or abnormal labs.    Medicare Attestation I have personally reviewed: The patient's medical and social history Their use of alcohol, tobacco or illicit drugs Their current medications and supplements The patient's functional ability including ADLs,fall risks, home safety risks, cognitive, and hearing and visual impairment Diet and physical activities Evidence for depression or mood disorders  The patient's weight, height, BMI have been recorded in the chart.  I have made referrals, counseling, and provided education to the patient based on review of the above and I have provided the patient with a written personalized care plan for preventive services.

## 2023-12-04 ENCOUNTER — Ambulatory Visit: Payer: Medicare Other | Admitting: Family Medicine

## 2023-12-04 ENCOUNTER — Encounter: Payer: Self-pay | Admitting: Family Medicine

## 2023-12-04 VITALS — BP 122/70 | HR 95 | Temp 98.7°F | Ht 69.5 in | Wt 174.4 lb

## 2023-12-04 DIAGNOSIS — K644 Residual hemorrhoidal skin tags: Secondary | ICD-10-CM

## 2023-12-04 DIAGNOSIS — Z5181 Encounter for therapeutic drug level monitoring: Secondary | ICD-10-CM

## 2023-12-04 DIAGNOSIS — K219 Gastro-esophageal reflux disease without esophagitis: Secondary | ICD-10-CM

## 2023-12-04 DIAGNOSIS — J439 Emphysema, unspecified: Secondary | ICD-10-CM

## 2023-12-04 DIAGNOSIS — R7303 Prediabetes: Secondary | ICD-10-CM | POA: Diagnosis not present

## 2023-12-04 DIAGNOSIS — Z125 Encounter for screening for malignant neoplasm of prostate: Secondary | ICD-10-CM | POA: Diagnosis not present

## 2023-12-04 DIAGNOSIS — I700 Atherosclerosis of aorta: Secondary | ICD-10-CM

## 2023-12-04 DIAGNOSIS — E7800 Pure hypercholesterolemia, unspecified: Secondary | ICD-10-CM

## 2023-12-04 DIAGNOSIS — J069 Acute upper respiratory infection, unspecified: Secondary | ICD-10-CM | POA: Diagnosis not present

## 2023-12-04 DIAGNOSIS — Z0000 Encounter for general adult medical examination without abnormal findings: Secondary | ICD-10-CM

## 2023-12-04 DIAGNOSIS — I2510 Atherosclerotic heart disease of native coronary artery without angina pectoris: Secondary | ICD-10-CM

## 2023-12-04 MED ORDER — HYDROCORTISONE (PERIANAL) 2.5 % EX CREA: 1.0000 | TOPICAL_CREAM | Freq: Two times a day (BID) | CUTANEOUS | 0 refills | Status: DC

## 2023-12-04 MED ORDER — ATORVASTATIN CALCIUM 40 MG PO TABS: 40.0000 mg | ORAL_TABLET | Freq: Every day | ORAL | 3 refills | Status: DC

## 2023-12-04 MED ORDER — OMEPRAZOLE 40 MG PO CPDR: 40.0000 mg | DELAYED_RELEASE_CAPSULE | Freq: Every day | ORAL | 3 refills | Status: DC

## 2023-12-05 LAB — LIPID PANEL
Chol/HDL Ratio: 2.8 {ratio} (ref 0.0–5.0)
Cholesterol, Total: 176 mg/dL (ref 100–199)
HDL: 62 mg/dL (ref >39)
LDL Chol Calc (NIH): 100 mg/dL — ABNORMAL HIGH (ref 0–99)
Triglycerides: 74 mg/dL (ref 0–149)
VLDL Cholesterol Cal: 14 mg/dL (ref 5–40)

## 2023-12-05 LAB — CMP14+EGFR
ALT: 20 [IU]/L (ref 0–44)
AST: 21 [IU]/L (ref 0–40)
Albumin: 4.6 g/dL (ref 3.8–4.8)
Alkaline Phosphatase: 93 [IU]/L (ref 44–121)
BUN/Creatinine Ratio: 13 (ref 10–24)
BUN: 16 mg/dL (ref 8–27)
Bilirubin Total: 0.9 mg/dL (ref 0.0–1.2)
CO2: 19 mmol/L — ABNORMAL LOW (ref 20–29)
Calcium: 9.4 mg/dL (ref 8.6–10.2)
Chloride: 101 mmol/L (ref 96–106)
Creatinine, Ser: 1.24 mg/dL (ref 0.76–1.27)
Globulin, Total: 3.1 g/dL (ref 1.5–4.5)
Glucose: 107 mg/dL — ABNORMAL HIGH (ref 70–99)
Potassium: 4.2 mmol/L (ref 3.5–5.2)
Sodium: 140 mmol/L (ref 134–144)
Total Protein: 7.7 g/dL (ref 6.0–8.5)
eGFR: 62 mL/min/{1.73_m2} (ref >59)

## 2023-12-05 LAB — CBC WITH DIFFERENTIAL/PLATELET
Basophils Absolute: 0 10*3/uL (ref 0.0–0.2)
Basos: 0 %
EOS (ABSOLUTE): 0 10*3/uL (ref 0.0–0.4)
Eos: 0 %
Hematocrit: 44.1 % (ref 37.5–51.0)
Hemoglobin: 14.1 g/dL (ref 13.0–17.7)
Immature Grans (Abs): 0 10*3/uL (ref 0.0–0.1)
Immature Granulocytes: 0 %
Lymphocytes Absolute: 1.2 10*3/uL (ref 0.7–3.1)
Lymphs: 13 %
MCH: 30.4 pg (ref 26.6–33.0)
MCHC: 32 g/dL (ref 31.5–35.7)
MCV: 95 fL (ref 79–97)
Monocytes Absolute: 0.9 10*3/uL (ref 0.1–0.9)
Monocytes: 10 %
Neutrophils Absolute: 6.9 10*3/uL (ref 1.4–7.0)
Neutrophils: 77 %
Platelets: 152 10*3/uL (ref 150–450)
RBC: 4.64 x10E6/uL (ref 4.14–5.80)
RDW: 12.7 % (ref 11.6–15.4)
WBC: 9.1 10*3/uL (ref 3.4–10.8)

## 2023-12-05 LAB — PSA: Prostate Specific Ag, Serum: 2.4 ng/mL (ref 0.0–4.0)

## 2023-12-05 LAB — HEMOGLOBIN A1C
Est. average glucose Bld gHb Est-mCnc: 128 mg/dL
Hgb A1c MFr Bld: 6.1 % — ABNORMAL HIGH (ref 4.8–5.6)

## 2024-04-01 ENCOUNTER — Other Ambulatory Visit: Payer: Self-pay | Admitting: Acute Care

## 2024-04-01 DIAGNOSIS — Z122 Encounter for screening for malignant neoplasm of respiratory organs: Secondary | ICD-10-CM

## 2024-04-01 DIAGNOSIS — Z87891 Personal history of nicotine dependence: Secondary | ICD-10-CM

## 2024-04-20 ENCOUNTER — Ambulatory Visit
Admission: RE | Admit: 2024-04-20 | Discharge: 2024-04-20 | Disposition: A | Discharge status: Home / Self Care | Source: Ambulatory Visit | Attending: Family Medicine | Admitting: Family Medicine

## 2024-04-20 DIAGNOSIS — Z87891 Personal history of nicotine dependence: Secondary | ICD-10-CM

## 2024-04-20 DIAGNOSIS — Z122 Encounter for screening for malignant neoplasm of respiratory organs: Secondary | ICD-10-CM

## 2024-05-13 ENCOUNTER — Other Ambulatory Visit: Payer: Self-pay

## 2024-05-13 DIAGNOSIS — Z87891 Personal history of nicotine dependence: Secondary | ICD-10-CM

## 2024-05-13 DIAGNOSIS — Z122 Encounter for screening for malignant neoplasm of respiratory organs: Secondary | ICD-10-CM

## 2024-12-15 NOTE — Patient Instructions (Addendum)
" °  HEALTH MAINTENANCE RECOMMENDATIONS:  It is recommended that you get at least 30 minutes of aerobic exercise at least 5 days/week (for weight loss, you may need as much as 60-90 minutes). This can be any activity that gets your heart rate up. This can be divided in 10-15 minute intervals if needed, but try and build up your endurance at least once a week.  Weight bearing exercise is also recommended twice weekly.  Eat a healthy diet with lots of vegetables, fruits and fiber.  Colorful foods have a lot of vitamins (ie green vegetables, tomatoes, red peppers, etc).  Limit sweet tea, regular sodas and alcoholic beverages, all of which has a lot of calories and sugar.  Up to 2 alcoholic drinks daily may be beneficial for men (unless trying to lose weight, watch sugars).  Drink a lot of water.  Sunscreen of at least SPF 30 should be used on all sun-exposed parts of the skin when outside between the hours of 10 am and 4 pm (not just when at beach or pool, but even with exercise, golf, tennis, and yard work!)  Use a sunscreen that says broad spectrum so it covers both UVA and UVB rays, and make sure to reapply every 1-2 hours.  Remember to change the batteries in your smoke detectors when changing your clock times in the spring and fall.  Carbon monoxide detectors are recommended for your home.  Use your seat belt every time you are in a car, and please drive safely and not be distracted with cell phones and texting while driving.    Travis Baldwin , Thank you for taking time to come for your Medicare Wellness Visit. I appreciate your ongoing commitment to your health goals. Please review the following plan we discussed and let me know if I can assist you in the future.   This is a list of the screening recommended for you and due dates:  Health Maintenance  Topic Date Due   COVID-19 Vaccine (6 - 2025-26 season) 08/09/2024   Medicare Annual Wellness Visit  12/03/2024   Screening for Lung Cancer   04/20/2025   Colon Cancer Screening  12/23/2027   DTaP/Tdap/Td vaccine (4 - Td or Tdap) 09/30/2028   Pneumococcal Vaccine for age over 59  Completed   Flu Shot  Completed   Hepatitis C Screening  Completed   Zoster (Shingles) Vaccine  Completed   Meningitis B Vaccine  Aged Out   Be sure to limit the sodium in your diet, as this can cause elevated blood pressures. Try and avoid canned foods, soups, soy sauce (Asian food), processed meats (lunch meats, sausage, bacon, etc). Pickles (and pickle juice) are also high in sodium--please avoid.  Please get grief counseling through Hospice, and stay engaged out in the community. Please work to get regular exercise as well (aerobic), as we discussed.  "

## 2024-12-15 NOTE — Progress Notes (Signed)
 "  Chief Complaint  Patient presents with   Medicare Wellness    Fasting AWV/CPE. Patient does not have any concerns.      Subjective:   Travis Baldwin is a 74 y.o. male who presents for a Medicare Annual Wellness Visit.  Visit info / Clinical Intake: Medicare Wellness Visit Type:: Subsequent Annual Wellness Visit Persons participating in visit and providing information:: patient Medicare Wellness Visit Mode:: In-person (required for WTM) Interpreter Needed?: No Pre-visit prep was completed: no AWV questionnaire completed by patient prior to visit?: no Living arrangements:: lives with spouse/significant other Patient's Overall Health Status Rating: very good Typical amount of pain: none Does pain affect daily life?: no Are you currently prescribed opioids?: no  Dietary Habits and Nutritional Risks How many meals a day?: 2 Eats fruit and vegetables daily?: yes Most meals are obtained by: preparing own meals In the last 2 weeks, have you had any of the following?: none Diabetic:: no  Functional Status Activities of Daily Living (to include ambulation/medication): Independent Ambulation: Independent Medication Administration: Independent Home Management (perform basic housework or laundry): Independent Manage your own finances?: yes Primary transportation is: driving Concerns about vision?: no *vision screening is required for WTM* Concerns about hearing?: no  Fall Screening Falls in the past year?: 0 Number of falls in past year: 0 Was there an injury with Fall?: 0 Fall Risk Category Calculator: 0 Patient Fall Risk Level: Low Fall Risk  Fall Risk Patient at Risk for Falls Due to: No Fall Risks Fall risk Follow up: Falls evaluation completed  Home and Transportation Safety: All rugs have non-skid backing?: yes All stairs or steps have railings?: yes Grab bars in the bathtub or shower?: yes (walk in tub downstairs) Have non-skid surface in bathtub or shower?:  yes Good home lighting?: yes Regular seat belt use?: yes Hospital stays in the last year:: no  Cognitive Assessment Difficulty concentrating, remembering, or making decisions? : no Will 6CIT or Mini Cog be Completed: yes Which version was used?: Version 3 : village, kitchen, baby Clock numbers correct?: yes Clock time correct (11:10)?: yes (9:00) Normal clock drawing test?: 2 How many words correct?: 3 Which version was used?: Version 3: village kitchen baby Mini-Cog Scoring: 5  Advance Directives (For Healthcare) Does Patient Have a Medical Advance Directive?: Yes Does patient want to make changes to medical advance directive?: No - Patient declined Type of Advance Directive: Healthcare Power of [place]; Living will; Out of facility DNR (pink MOST or yellow form) Copy of Healthcare Power of Attorney in Chart?: Yes - validated most recent copy scanned in chart (See row information) Copy of Living Will in Chart?: Yes - validated most recent copy scanned in chart (See row information) Out of facility DNR (pink MOST or yellow form) in Chart? (Ambulatory ONLY): Yes - validated most recent copy scanned in chart  Reviewed/Updated  Reviewed/Updated: Patient Goals    Allergies (verified) Codeine and Vicodin [hydrocodone-acetaminophen]   Current Medications (verified) Outpatient Encounter Medications as of 12/16/2024  Medication Sig   aspirin EC 81 MG tablet Take 81 mg by mouth daily.   atorvastatin  (LIPITOR) 40 MG tablet Take 1 tablet (40 mg total) by mouth daily.   co-enzyme Q-10 30 MG capsule Take 30 mg by mouth daily.    Cranberry 500 MG TABS Take 1 tablet by mouth daily.   MAGNESIUM GLYCINATE PO Take 1 tablet by mouth daily.   Misc Natural Products (OSTEO BI-FLEX TRIPLE STRENGTH PO) Take 2 tablets by mouth daily.  Multiple Vitamins-Minerals (MULTIVITAMIN WITH MINERALS) tablet Take 1 tablet by mouth daily.   omeprazole  (PRILOSEC) 40 MG capsule Take 1 capsule (40 mg total) by  mouth daily. Take prior to dinner   Potassium Chloride (K+ POTASSIUM PO) Take 1 tablet by mouth daily as needed (muscle cramps).   [DISCONTINUED] hydrocortisone  (ANUSOL -HC) 2.5 % rectal cream Place 1 Application rectally 2 (two) times daily.   OVER THE COUNTER MEDICATION Take 1 tablet by mouth 2 (two) times daily. Pro beta prostate   No facility-administered encounter medications on file as of 12/16/2024.    History: Past Medical History:  Diagnosis Date   Colon polyp    hyperplastic, adenomatous (2008); Dr. Avram   Diverticulosis    Hyperlipidemia    Impaired fasting glucose 02/2006   Internal hemorrhoids Grade 2 prolapsing with bleeding 09/03/2013   Microscopic hematuria    followed by [facility]; normal cystoscopy 05/2009   Smoker    Past Surgical History:  Procedure Laterality Date   CHEST TUBE INSERTION  1996   pockets of infection on outside of lung   COLONOSCOPY  multiple; 07/2013   Dr. Avram   COLONOSCOPY  12/22/2020   Dr [facility]ram, [facility]   HEMORRHOID BANDING  09/2013   Dr. Avram   thumb surgery Left 05/17/2021   removal of epidermal inclusion cyst, Dr. 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   Drug use: No   Sexual activity: Yes    Partners: Female   Tobacco Counseling Counseling given: Not Answered  SDOH Screenings   Food Insecurity: No Food Insecurity (12/16/2024)  Housing: Low Risk (12/16/2024)  Transportation Needs: No Transportation Needs (12/16/2024)  Utilities: Not At Risk (12/16/2024)  Depression (PHQ2-9): Low Risk (12/16/2024)  Financial Resource Strain: Low Risk (12/16/2024)  Physical Activity: Insufficiently Active (12/16/2024)  Social Connections: Socially Isolated (12/16/2024)  Stress: Stress Concern Present (12/16/2024)  Tobacco Use: Medium Risk (12/16/2024)  Health Literacy: Adequate Health Literacy (12/16/2024)   See flowsheets for full screening details  Depression Screen PHQ 2 & 9 Depression Scale- Over the past 2 weeks, how often have you been bothered by any of the following problems? Little interest or pleasure in doing things:  0 Feeling down, depressed, or hopeless (PHQ Adolescent also includes...irritable): 0 PHQ-2 Total Score: 0     Goals Addressed             This Visit's Progress    Exercise 3x per week (30 min per time)       Would like to exercise more.             Objective:    Today's Vitals   12/16/24 0922  BP: (!) 150/80  Pulse: 80  Weight: 173 lb 12.8 oz (78.8 kg)  Height: 5' 9 (1.753 m)   Body mass index is 25.67 kg/m.  Hearing/Vision screen No results found. Immunizations and Health Maintenance Health Maintenance  Topic Date Due   Medicare Annual Wellness (AWV)  12/03/2024   Lung Cancer Screening  04/20/2025   COVID-19 Vaccine (7 - 2025-26 season) 06/15/2025   Colonoscopy  12/23/2027   DTaP/Tdap/Td (4 - Td or Tdap) 09/30/2028   Pneumococcal Vaccine: 50+ Years  Completed   Influenza Vaccine   Completed   Hepatitis C Screening  Completed   Zoster Vaccines- Shingrix   Completed   Meningococcal B Vaccine  Aged Out        Assessment/Plan:  This is a routine wellness examination for Travis Baldwin.  Patient Care Team: Randol Dawes, MD as PCP - General (Family Medicine) Dr. Mliss Ku, DDS as Consulting Physician (Dentistry) Avram Lupita BRAVO, MD as Consulting Physician (Gastroenterology) Lanis Pupa, MD as Consulting Physician (Neurosurgery) Matilda Senior, MD as Consulting Physician (Urology) Lad[place], [place], [place] as Referring Physician (Optometry)  I have personally reviewed and noted the following in the patients chart:   Medical and social history Use of alcohol, tobacco or illicit drugs  Current medications and supplements including opioid prescriptions. Functional ability and status Nutritional status Physical activity Advanced directives List of other physicians Hospitalizations, surgeries, and ER visits in previous 12 months Vitals Screenings to include cognitive, depression, and falls Referrals and appointments  Orders Placed This Encounter  Procedures   Pfizer Comirnaty Covid-19 Vaccine 40yrs & older   CBC with Differential/Platelet   CMP14+EGFR   Hemoglobin A1c   PSA   Lipid panel   Magnesium   Ambulatory referral to Dermatology    Referral Priority:   Routine    Referral Type:   Consultation    Referral Reason:   Specialty Services Required    Requested Specialty:   Dermatology    Number of Visits Requested:   1   In addition, I have reviewed and discussed with patient certain preventive protocols, quality metrics, and best practice recommendations. A written personalized care plan for preventive services as well as general preventive health recommendations were provided to patient.   Dawes DELENA Randol, MD   12/16/2024   Return in about 1 year (around 12/16/2025) for CPE.  After Visit Summary: (In Person-Printed) AVS printed and given to the  patient  Nurse Notes: none "

## 2024-12-15 NOTE — Progress Notes (Signed)
 Chief Complaint  Patient presents with   Medicare Wellness    Fasting AWV/CPE. Patient does not have any concerns.    Travis Baldwin is a 74 y.o. male who presents for annual physical, Medicare annual wellness visit (see separate note), and follow-up on chronic medical conditions.   His mother passed away in 11-13-25. This has been hard.  He is aware that he is eligible for grief counseling through hospice, but hasn't contacted them. His wife is concerned about him.  Last year he was having blood in his stool, which had started after straining. He had been having to do a lot of lifting of his mother at that time. He has h/o hemorrhoids. Exam showed non-inflamed hemorrhoids, heme negative stool. He denies any further issues with blood in the stool or hemorrhoid flares.  Prediabetes: His fasting sugars have been elevated since 2021, with A1c slowly creeping up since 2022. Last A1c was 6.1% in 11/2023. He continues to try and limit bread (only has whole grain bread) and sweets.  Rare cookie. Didn't splurge too much over the holidays (a few servings of cake/pie through the holidays). Has cranberry juice every morning (diet 5 calorie). Has 1 rum and pepsi zero, 1-2/day (10/week). Denies significant dietary changes in the last year.   Component Ref Range & Units (hover) 1 yr ago (12/04/23) 2 yr ago (11/21/22) 3 yr ago (11/12/21) 3 yr ago (04/25/21) 4 yr ago (10/30/20) 4 yr ago (12/22/19) 5 yr ago (10/06/19)  Glucose 107 High  108 High  104 High  98 R 107 High  R  94 R   Component Ref Range & Units (hover) 1 yr ago (12/04/23) 2 yr ago (11/21/22) 3 yr ago (11/12/21) 3 yr ago (04/25/21)  Hgb A1c MFr Bld 6.1 High  6.0 High  CM 5.9 High  CM 5.8 High     Hypercholesterolemia:  He reports compliance with atorvastatin  40mg , and denies side effects. He continues to try and limit red meat and cheese.  Has a steak once a month, pork 2-3 times/week. Eats a lot of chicken. Eating eggs less often  since his mom passed.  Currently has 2-3/week. He is due for recheck.  He reports diet hasn't changed.  Lab Results  Component Value Date   CHOL 176 12/04/2023   HDL 62 12/04/2023   LDLCALC 100 (H) 12/04/2023   TRIG 74 12/04/2023   CHOLHDL 2.8 12/04/2023    Hoarseness since he quit smoking.  He had evaluation by Dr. Ethyl in 01/2021, normal vocal cords on fiberoptic laryngoscopy at visit.  He felt hoarseness was due to LPR.  He continues to take 40mg  omeprazole  daily, denies any heartburn. He has some hoarseness today because his throat is dry, didn't drink anything. Otherwise he really doesn't note much hoarseness or issues.  He no longer sings (and hasn't been going to church). He denies any dysphagia.  Only rarely he has trouble with swallowing water (getting caught up in upper part of throat).  He reports sometimes having a dry cough that can last for a few weeks before resolving.  The cough resolves on its own after a couple of weeks, but notes that using Flonase prn seems to help.  Last this occurred was about 5-6 weeks ago.  Flonase helps, it slowly goes away.  Former smoker: He quit smoking 10/08/2020, is doing well. He smoked since age 89, close to a pack/day for over 39 years. He denies any shortness of breath. He gets yearly lung  cancer screening, last was in 04/2024: IMPRESSION: Lung-RADS 2, benign appearance or behavior. Continue annual screening with low-dose chest CT without contrast in 12 months. Aortic Atherosclerosis (ICD10-I70.0) and Emphysema (ICD10-J43.9).   Coronary and aortic atherosclerosis:  He saw Dr. Maranda in 01/2020, who reviewed films, noting calcifications in LAD and RCA.  Given his lack of symptoms (and normal EKG at that visit), it was recommended that he continue the aspirin and statin.  No further evaluation needed unless symptoms develop.  He denies chest pain, palpitations, DOE, edema. He is due for recheck of lipids. Lab Results  Component Value Date    CHOL 176 12/04/2023   HDL 62 12/04/2023   LDLCALC 100 (H) 12/04/2023   TRIG 74 12/04/2023   CHOLHDL 2.8 12/04/2023   He checks BP periodically. BP was 119/80 yesterday. 2 weeks ago he recalls 137 systolic. He does report having dill pickle juice for leg cramps.  Had some last night.   Immunization History  Administered Date(s) Administered   Fluad Quad(high Dose 65+) 10/06/2019, 10/30/2020   Fluad Trivalent(High Dose 65+) 09/14/2024   INFLUENZA, HIGH DOSE SEASONAL PF 09/12/2016, 09/30/2018, 07/18/2022, 08/28/2023   Influenza Split 09/09/2011, 10/09/2012   Influenza Whole 09/09/1989, 12/05/1999   Influenza,inj,Quad PF,6+ Mos 09/29/2013, 12/21/2014, 11/16/2015   Influenza-Unspecified 09/05/2021   PFIZER Comirnaty(Gray Top)Covid-19 Tri-Sucrose Vaccine 04/25/2021   PFIZER(Purple Top)SARS-COV-2 Vaccination 03/28/2020, 04/18/2020, 10/30/2020   PNEUMOCOCCAL CONJUGATE-20 12/30/2023   PPD Test 09/10/1995   Pfizer Covid-19 Vaccine Bivalent Booster 5yrs & up 11/12/2021   Pneumococcal Conjugate-13 09/12/2016   Pneumococcal Polysaccharide-23 09/10/1995, 09/09/2011, 09/24/2017   Respiratory Syncytial Virus Vaccine,Recomb Aduvanted(Arexvy) 12/24/2023   Td 11/23/1997   Tdap 01/10/2009, 09/30/2018   Zoster Recombinant(Shingrix ) 09/30/2018, 12/30/2018   Zoster, Live 12/21/2014   Last colonoscopy: 12/2020 with Dr. Avram; 1 adenomatous polyp.  7 yr f/u recommended Last PSA:   Lab Results  Component Value Date   PSA1 2.4 12/04/2023   PSA1 2.5 11/21/2022   PSA1 2.8 11/12/2021   PSA 2.8 09/24/2017   PSA 3.4 09/12/2016   PSA 2.51 12/21/2014   Ophtho: yearly.  Wears one contact for reading and one for distance.   Dentist: wears dentures, went to dentist >5 years ago for oral exam.   Exercise: Lifts weight almost every night, and squats. Some walking, nothing regular.   End of Life Discussion:  Patient has a living will and a healthcare power of attorney, scanned into chart.    PMH, PSH,  SH and FH were reviewed and updated.  Outpatient Encounter Medications as of 12/16/2024  Medication Sig Note   aspirin EC 81 MG tablet Take 81 mg by mouth daily.    atorvastatin  (LIPITOR) 40 MG tablet Take 1 tablet (40 mg total) by mouth daily.    co-enzyme Q-10 30 MG capsule Take 30 mg by mouth daily.     Cranberry 500 MG TABS Take 1 tablet by mouth daily.    MAGNESIUM GLYCINATE PO Take 1 tablet by mouth daily.    Misc Natural Products (OSTEO BI-FLEX TRIPLE STRENGTH PO) Take 2 tablets by mouth daily.    Multiple Vitamins-Minerals (MULTIVITAMIN WITH MINERALS) tablet Take 1 tablet by mouth daily.    omeprazole  (PRILOSEC) 40 MG capsule Take 1 capsule (40 mg total) by mouth daily. Take prior to dinner    Potassium Chloride (K+ POTASSIUM PO) Take 1 tablet by mouth daily as needed (muscle cramps). 12/16/2024: As needed   [DISCONTINUED] hydrocortisone  (ANUSOL -HC) 2.5 % rectal cream Place 1 Application rectally 2 (two)  times daily. 12/16/2024: As needed   OVER THE COUNTER MEDICATION Take 1 tablet by mouth 2 (two) times daily. Pro beta prostate    No facility-administered encounter medications on file as of 12/16/2024.   Allergies  Allergen Reactions   Codeine Hives   Vicodin [Hydrocodone-Acetaminophen] Other (See Comments)    Shakiness.     ROS: The patient denies anorexia, fever, headaches, vision loss, decreased hearing, ear pain, chest pain, palpitations, dizziness, syncope, dyspnea on exertion, cough, swelling, nausea, vomiting, diarrhea, constipation, abdominal pain, melena, indigestion/heartburn, incontinence, erectile dysfunction, nocturia, genital lesions, weakness, tremor, suspicious skin lesions, depression, anxiety, abnormal bleeding/bruising, or enlarged lymph nodes.   Slightly weakened urinary stream but better than it was; prostate supplements are helpful.  Gets up once or twice to void. Denies dysuria, urinary frequency, hematuria, incontinence. No recent hemorrhoid flares Still gets  occasional L calf cramps at night.  Uses potassium supplement after getting, which he thinks helps.  He also started taking Mg to prevent them, which also seems to help. He sometimes also drinks dill pickle juice/vinegar at night--it relieves cramps. +hoarseness is less often, related to dry throat. L knee pain improved since taking osteobiflex.     PHYSICAL EXAM:  BP (!) 150/80   Pulse 80   Ht 5' 9 (1.753 m)   Wt 173 lb 12.8 oz (78.8 kg)   BMI 25.67 kg/m   Repeat BP by nurse was 150/70  Wt Readings from Last 3 Encounters:  12/16/24 173 lb 12.8 oz (78.8 kg)  12/04/23 174 lb 6.4 oz (79.1 kg)  11/21/22 175 lb 6.4 oz (79.6 kg)    General Appearance:   Alert, cooperative, no distress, appears stated age. Some throat-clearing during visit, no cough.  Head:   Normocephalic, without obvious abnormality, atraumatic    Eyes:   PERRL, conjunctiva/corneas clear, EOM's intact, fundi benign    Ears:   Normal TM's and external ear canals.  Non-obstructive cerumen on the left  Nose:   No drainage or sinus tenderness  Throat:   No erythema of posterior OP. Has dentures in place, limiting evaluation of mucosa.  Neck:   Supple, no lymphadenopathy; thyroid: no enlargement/tenderness/ nodules; no carotid bruit or JVD    Back:   Spine nontender, no curvature, ROM normal, no CVA tenderness    Lungs:   Clear to auscultation bilaterally without wheezes, rales or ronchi; respirations unlabored    Chest Wall:   No tenderness or deformity    Heart:   Regular rate and rhythm, S1 and S2 normal, no murmur, rub or gallop.  Breast Exam:   No chest wall tenderness, masses or gynecomastia    Abdomen:   Soft, non-tender, nondistended, normoactive bowel sounds, no masses, no hepatosplenomegaly    Genitalia:   No lesions. Testicles/scrotum normal. No inguinal hernias  Rectal:   +non-inflamed external hemorrhoids.  Normal sphincter tone.  Prostate is mildly enlarged (slightly more prominent on R), smooth, no  nodules.  Heme negative brown stool  Extremities:   No clubbing, cyanosis or edema    Pulses:   2+ and symmetric all extremities    Skin:   Skin color, texture, turgor normal, no rashes.  There is a benign appearing mole (vs SK) on top of his head on the left, with an overlying hyperkeratotic area. There is also an atypical/curved mole on R cheek.  Lymph nodes:   Cervical, supraclavicular nodes normal    Neurologic:   Normal strength, sensation and gait; reflexes 2+ and symmetric throughout  Psych: Normal mood, affect, hygiene and grooming   ASSESSMENT/PLAN:  Annual physical exam  Medicare annual wellness visit, subsequent  Pure hypercholesterolemia - reviewed low cholesterol diet, cont statin - Plan: Lipid panel  Laryngopharyngeal reflux (LPR) - cont 40mg  omeprazole .  Prediabetes - reviewed proper diet, encouraged daily exercise - Plan: CMP14+EGFR, Hemoglobin A1c  Aortic atherosclerosis - cont statin - Plan: Lipid panel  Atherosclerosis of native coronary artery of native heart without angina pectoris - asymptomatic. Cont statin, ASA. Encouraged daily exercise - Plan: Lipid panel  Need for COVID-19 vaccine - Plan: Pfizer Comirnaty Covid-19 Vaccine 63yrs & older  Screening for prostate cancer - Plan: PSA  Medication monitoring encounter - Plan: CBC with Differential/Platelet, CMP14+EGFR, Lipid panel, Magnesium  Muscle cramps - encouraged to stay well hydrated.  To avoid pickle juice, high in Na, affecting his BP - Plan: Magnesium  Skin lesion - poss pre-cancerous lesion on head, and atypical nevi on cheek. Refer to derm for eval. - Plan: Ambulatory referral to Dermatology  Elevated BP without diagnosis of hypertension - counseled re: low sodium diet; encouraged regular exercise.  Cont to monitor and f/u if BP remains elevated.  Was normal yesterday  Grief counseling - Has been at home a lot, seems down, wife was very concerned.  Strongly encouraged grief  counseling and provided some    PSA screening, risks/benefits reviewed, recommended at least 30 minutes of aerobic activity at least 5 days/week, weight-bearing exercise at least 2x/week; proper sunscreen use reviewed; healthy diet and alcohol recommendations (less than or equal to 2 drinks/day) reviewed; regular seatbelt use; changing batteries in smoke detectors. Immunization recommendations discussed--continue yearly flu shots.  COVID booster recommended, given today Colonoscopy recommendations reviewed, due again 12/2027.    MOST form reviewed, Full Code, Full Care    F/u 1 year for CPE/AWV, sooner prn if any worsening symptoms or abnormal labs.

## 2024-12-16 ENCOUNTER — Encounter: Payer: Self-pay | Admitting: Family Medicine

## 2024-12-16 ENCOUNTER — Ambulatory Visit: Payer: Medicare Other | Admitting: Family Medicine

## 2024-12-16 VITALS — BP 150/70 | HR 80 | Ht 69.0 in | Wt 173.8 lb

## 2024-12-16 DIAGNOSIS — K219 Gastro-esophageal reflux disease without esophagitis: Secondary | ICD-10-CM

## 2024-12-16 DIAGNOSIS — E7800 Pure hypercholesterolemia, unspecified: Secondary | ICD-10-CM

## 2024-12-16 DIAGNOSIS — Z23 Encounter for immunization: Secondary | ICD-10-CM | POA: Diagnosis not present

## 2024-12-16 DIAGNOSIS — R252 Cramp and spasm: Secondary | ICD-10-CM | POA: Diagnosis not present

## 2024-12-16 DIAGNOSIS — I700 Atherosclerosis of aorta: Secondary | ICD-10-CM

## 2024-12-16 DIAGNOSIS — R7303 Prediabetes: Secondary | ICD-10-CM | POA: Diagnosis not present

## 2024-12-16 DIAGNOSIS — Z0000 Encounter for general adult medical examination without abnormal findings: Secondary | ICD-10-CM

## 2024-12-16 DIAGNOSIS — I2510 Atherosclerotic heart disease of native coronary artery without angina pectoris: Secondary | ICD-10-CM

## 2024-12-16 DIAGNOSIS — L989 Disorder of the skin and subcutaneous tissue, unspecified: Secondary | ICD-10-CM

## 2024-12-16 DIAGNOSIS — Z125 Encounter for screening for malignant neoplasm of prostate: Secondary | ICD-10-CM

## 2024-12-16 DIAGNOSIS — Z7189 Other specified counseling: Secondary | ICD-10-CM | POA: Diagnosis not present

## 2024-12-16 DIAGNOSIS — R030 Elevated blood-pressure reading, without diagnosis of hypertension: Secondary | ICD-10-CM | POA: Diagnosis not present

## 2024-12-16 DIAGNOSIS — Z5181 Encounter for therapeutic drug level monitoring: Secondary | ICD-10-CM | POA: Diagnosis not present

## 2024-12-16 LAB — CMP14+EGFR
ALT: 29 IU/L (ref 0–44)
AST: 25 IU/L (ref 0–40)
Albumin: 5 g/dL — ABNORMAL HIGH (ref 3.8–4.8)
Alkaline Phosphatase: 85 IU/L (ref 47–123)
BUN/Creatinine Ratio: 13 (ref 10–24)
BUN: 15 mg/dL (ref 8–27)
Bilirubin Total: 0.5 mg/dL (ref 0.0–1.2)
CO2: 23 mmol/L (ref 20–29)
Calcium: 10 mg/dL (ref 8.6–10.2)
Chloride: 101 mmol/L (ref 96–106)
Creatinine, Ser: 1.13 mg/dL (ref 0.76–1.27)
Globulin, Total: 2.7 g/dL (ref 1.5–4.5)
Glucose: 100 mg/dL — ABNORMAL HIGH (ref 70–99)
Potassium: 3.9 mmol/L (ref 3.5–5.2)
Sodium: 141 mmol/L (ref 134–144)
Total Protein: 7.7 g/dL (ref 6.0–8.5)
eGFR: 69 mL/min/1.73

## 2024-12-16 LAB — CBC WITH DIFFERENTIAL/PLATELET
Basophils Absolute: 0 x10E3/uL (ref 0.0–0.2)
Basos: 1 %
EOS (ABSOLUTE): 0.1 x10E3/uL (ref 0.0–0.4)
Eos: 2 %
Hematocrit: 45.6 % (ref 37.5–51.0)
Hemoglobin: 15 g/dL (ref 13.0–17.7)
Immature Grans (Abs): 0 x10E3/uL (ref 0.0–0.1)
Immature Granulocytes: 0 %
Lymphocytes Absolute: 1.3 x10E3/uL (ref 0.7–3.1)
Lymphs: 24 %
MCH: 32.3 pg (ref 26.6–33.0)
MCHC: 32.9 g/dL (ref 31.5–35.7)
MCV: 98 fL — ABNORMAL HIGH (ref 79–97)
Monocytes Absolute: 0.4 x10E3/uL (ref 0.1–0.9)
Monocytes: 8 %
Neutrophils Absolute: 3.4 x10E3/uL (ref 1.4–7.0)
Neutrophils: 65 %
Platelets: 185 x10E3/uL (ref 150–450)
RBC: 4.65 x10E6/uL (ref 4.14–5.80)
RDW: 13.2 % (ref 11.6–15.4)
WBC: 5.1 x10E3/uL (ref 3.4–10.8)

## 2024-12-16 LAB — LIPID PANEL
Chol/HDL Ratio: 2.5 ratio (ref 0.0–5.0)
Cholesterol, Total: 175 mg/dL (ref 100–199)
HDL: 71 mg/dL
LDL Chol Calc (NIH): 92 mg/dL (ref 0–99)
Triglycerides: 64 mg/dL (ref 0–149)
VLDL Cholesterol Cal: 12 mg/dL (ref 5–40)

## 2024-12-16 LAB — PSA: Prostate Specific Ag, Serum: 3.3 ng/mL (ref 0.0–4.0)

## 2024-12-16 LAB — MAGNESIUM: Magnesium: 2 mg/dL (ref 1.6–2.3)

## 2024-12-16 LAB — HEMOGLOBIN A1C
Est. average glucose Bld gHb Est-mCnc: 126 mg/dL
Hgb A1c MFr Bld: 6 % — ABNORMAL HIGH (ref 4.8–5.6)

## 2024-12-17 ENCOUNTER — Ambulatory Visit: Payer: Self-pay | Admitting: Family Medicine

## 2024-12-17 MED ORDER — ATORVASTATIN CALCIUM 40 MG PO TABS: 40.0000 mg | ORAL_TABLET | Freq: Every day | ORAL | 3 refills | Status: AC

## 2024-12-27 ENCOUNTER — Other Ambulatory Visit: Payer: Self-pay | Admitting: Family Medicine

## 2024-12-27 DIAGNOSIS — K219 Gastro-esophageal reflux disease without esophagitis: Secondary | ICD-10-CM

## 2024-12-29 ENCOUNTER — Encounter: Payer: Self-pay | Admitting: Family Medicine

## 2025-01-03 ENCOUNTER — Telehealth: Payer: Self-pay | Admitting: *Deleted

## 2025-01-03 NOTE — Telephone Encounter (Signed)
 Just an FYI. I had to call [facility]ie about CPAP supplies and Cheryl wanted me to let you know he has not had any more nosebleeds.

## 2026-01-02 ENCOUNTER — Encounter: Admitting: Family Medicine
